# Patient Record
Sex: Male | Born: 1965 | Race: White | Hispanic: No | Marital: Married | Smoking: Former smoker
Health system: Southern US, Community
[De-identification: ages and names within clinical notes are randomized; demographics above are authoritative.]

## PROBLEM LIST (undated history)

## (undated) DIAGNOSIS — E118 Type 2 diabetes mellitus with unspecified complications: Secondary | ICD-10-CM

## (undated) DIAGNOSIS — E785 Hyperlipidemia, unspecified: Secondary | ICD-10-CM

## (undated) DIAGNOSIS — I2510 Atherosclerotic heart disease of native coronary artery without angina pectoris: Secondary | ICD-10-CM

## (undated) DIAGNOSIS — E119 Type 2 diabetes mellitus without complications: Secondary | ICD-10-CM

## (undated) HISTORY — DX: Type 2 diabetes mellitus with unspecified complications: E11.8

## (undated) HISTORY — DX: Atherosclerotic heart disease of native coronary artery without angina pectoris: I25.10

## (undated) HISTORY — DX: Hyperlipidemia, unspecified: E78.5

---

## 2004-06-25 ENCOUNTER — Ambulatory Visit: Payer: Self-pay | Admitting: Sports Medicine

## 2004-06-25 ENCOUNTER — Observation Stay (HOSPITAL_COMMUNITY): Admission: EM | Admit: 2004-06-25 | Discharge: 2004-06-26 | Payer: Self-pay | Admitting: Emergency Medicine

## 2004-11-06 ENCOUNTER — Ambulatory Visit (HOSPITAL_COMMUNITY): Admission: RE | Admit: 2004-11-06 | Discharge: 2004-11-06 | Payer: Self-pay | Admitting: Chiropractic Medicine

## 2005-09-26 ENCOUNTER — Emergency Department (HOSPITAL_COMMUNITY): Admission: EM | Admit: 2005-09-26 | Discharge: 2005-09-26 | Payer: Self-pay | Admitting: Emergency Medicine

## 2006-02-10 IMAGING — CR DG KNEE COMPLETE 4+V*R*
4 series · 4 of 4 positions shown · non-contrast
Comparison: None.
COMPARISON: None.

CLINICAL DATA: Fell, right knee and lower leg pain.

RIGHT KNEE - 4 VIEW  06/26/2004:

[view not recorded (1 of 4)]
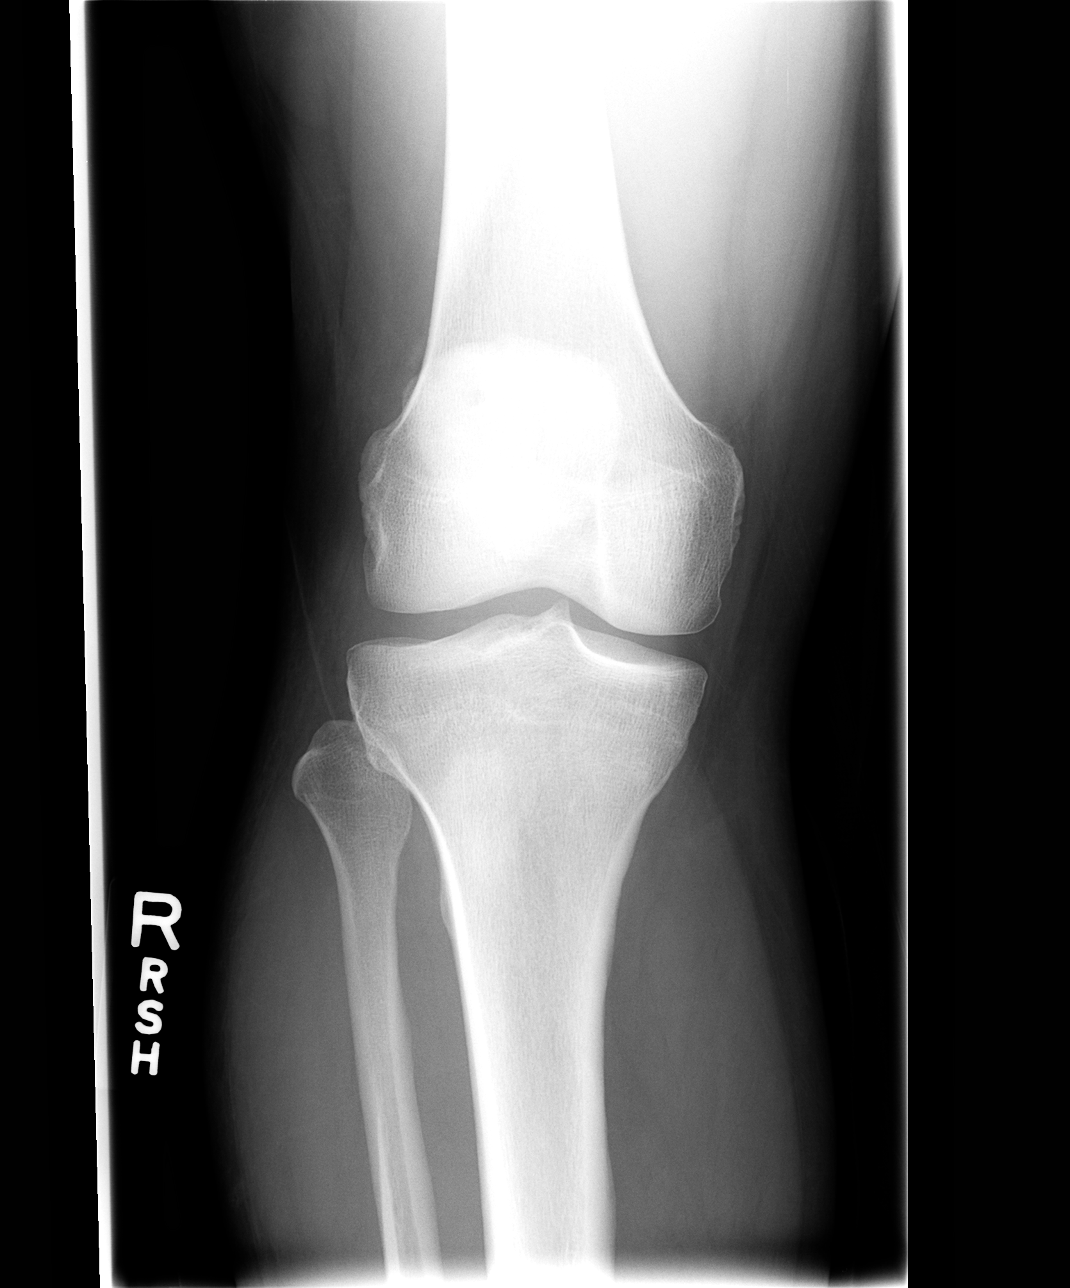

[view not recorded (2 of 4)]
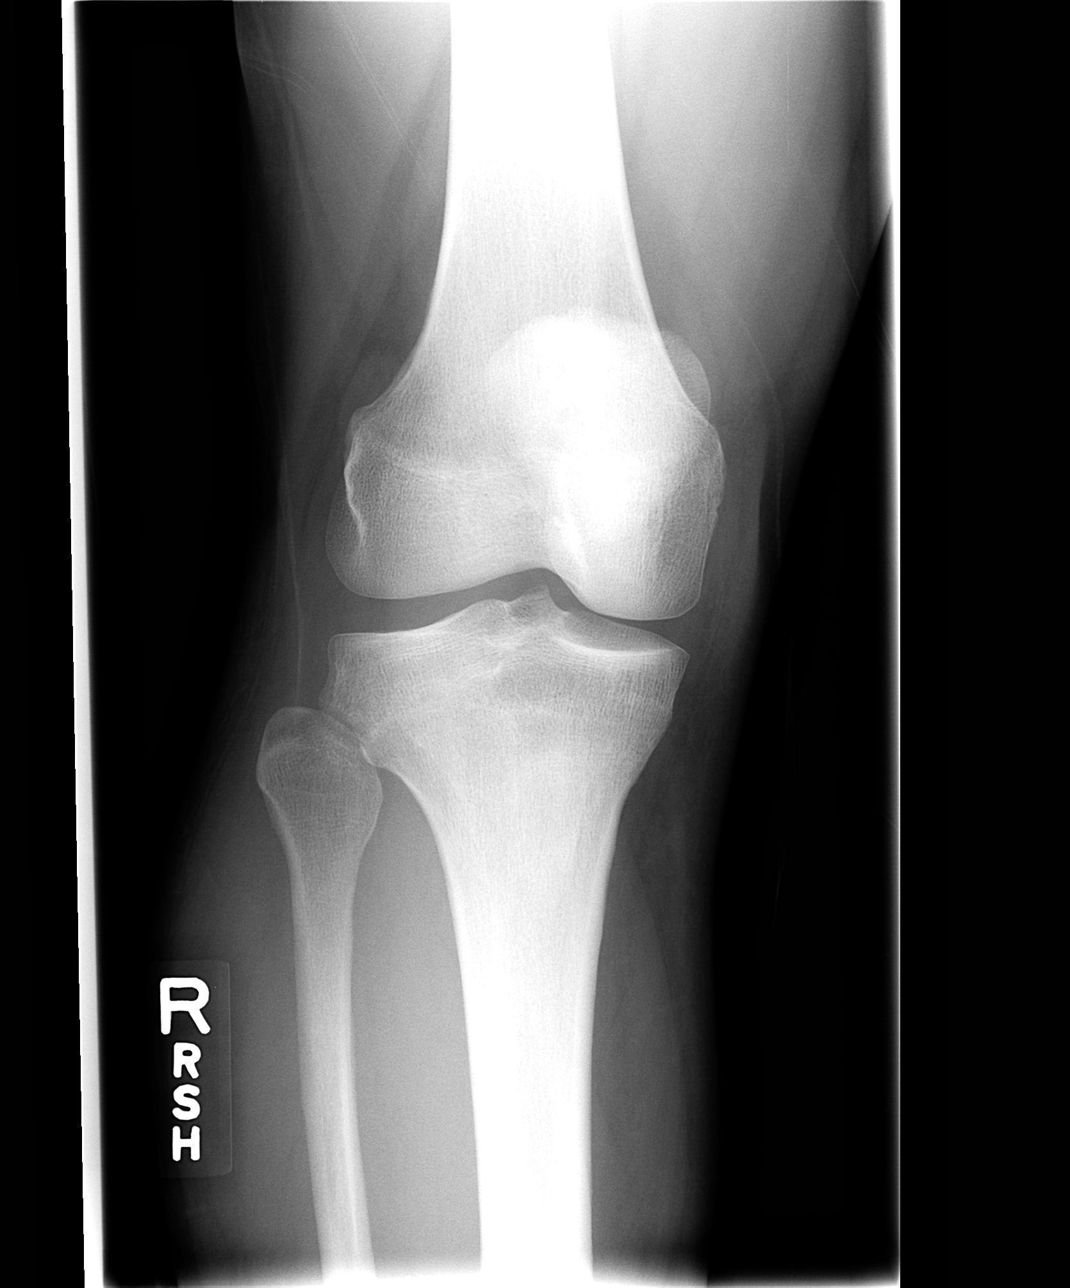

[view not recorded (3 of 4)]
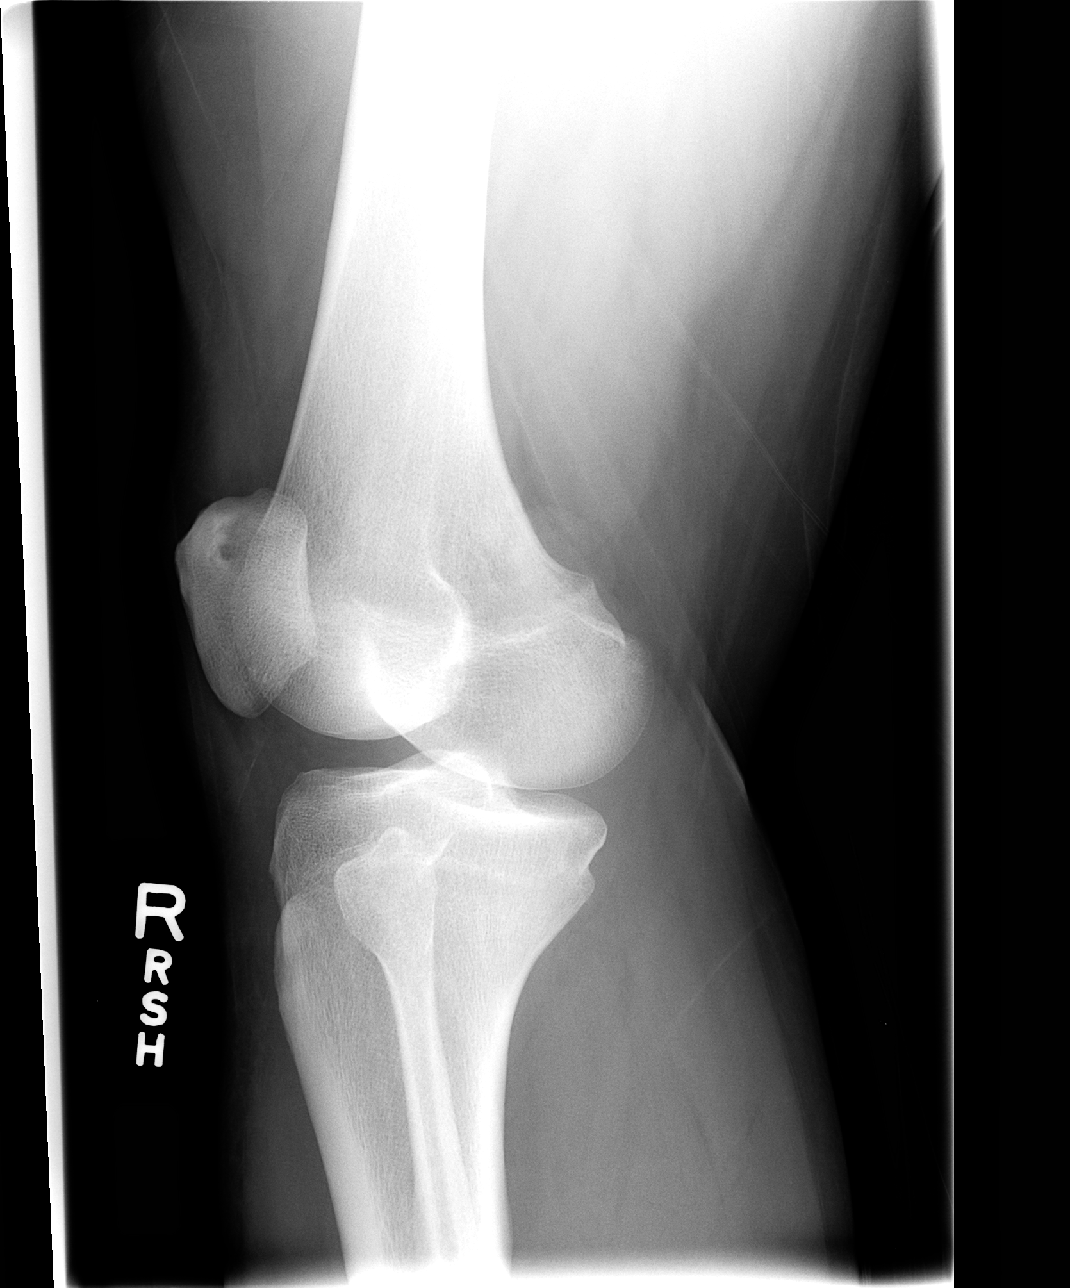

[view not recorded (4 of 4)]
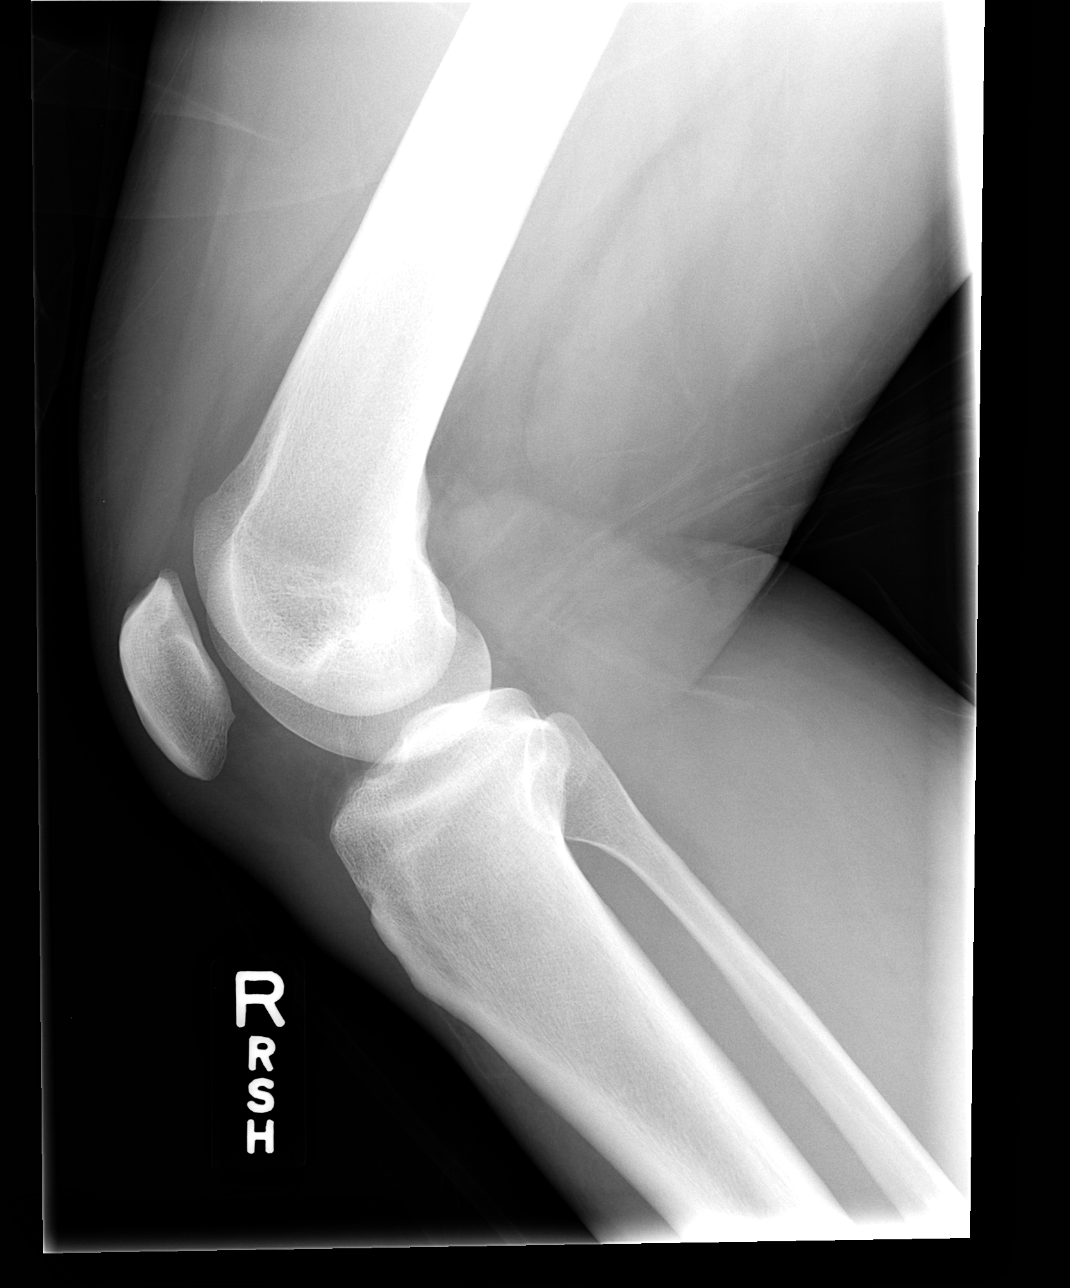

[4 of 4 positions shown; findings below may reference images not displayed]

FINDINGS: There is no evidence of acute or subacute fracture or dislocation.
The joint spaces are well-preserved. There is no evidence of significant joint
effusion. Note is made of a small lucency in the superolateral patella in the
characteristic location for a dorsal defect.
IMPRESSION: No acute skeletal abnormalities. Dorsal defect of the patella. 

RIGHT TIBIA-FIBULA - 2 VIEW  06/26/2004:
FINDINGS: There is no evidence of acute or subacute fracture. The visualized
ankle joint appears intact. There are no intrinsic osseous abnormalities apart
from a small plantar calcaneal spur.
IMPRESSION: Normal examination of the tibia and fibula. Small plantar calcaneal spur.

## 2006-08-14 ENCOUNTER — Emergency Department (HOSPITAL_COMMUNITY): Admission: EM | Admit: 2006-08-14 | Discharge: 2006-08-14 | Payer: Self-pay | Admitting: Emergency Medicine

## 2010-08-18 NOTE — H&P (Signed)
Jason Joseph, PODOLSKY NO.:  [id number]   MEDICAL RECORD NO.:  [id number]          PATIENT TYPE:  EMS   LOCATION:  MAJO                         FACILITY:  [facility]   PHYSICIAN:  Melina Fiddler, MD DATE OF BIRTH:  07/06/1965   DATE OF ADMISSION:  06/25/2004  DATE OF DISCHARGE:                                HISTORY & PHYSICAL   PHYSICIANS:  Attending physician:  Melina Fiddler, MD  Primary care physician:  Gentry Fitz.   CHIEF COMPLAINT:  Chest pain and right leg pain.   HISTORY OF PRESENT ILLNESS:  The patient is a 45 year old white male who  presents with atypical chest pain.  The patient states it started  approximately two months ago.  The pain is substernal and left sided and is  described as a squeezing sensation.  The pain is atypical and can be brought  on by stress; however, the pain can also be brought on by exertion such as  running and sometimes improves with relaxation.  It is associated with left  arm pain and occasional nausea.  The patient has no known history of  hypertension, high cholesterol, or diabetes.  The patient does have a 20-  pack-year history of tobacco use.  The patient does have a family history of  coronary artery disease including several uncles with MIs or coronary artery  disease in their 44s.  The patient has also been under a great deal of  stress recently as he has just lost his stepdaughter.   The patient has also been having dyspepsia and heartburn for approximately  two years, especially with spicy foods.  This has been getting worse over  the last few months.  He also gives a history of possible black stools.  He  denies any hematochezia, denies any hematemesis.  He says that the pain in  the chest is different from the dyspepsia that he feels after he eats spicy  foods.  Denies any right upper quadrant pain.   The patient also presents with right knee pain.  He scrapped/rolled his knee  on the ground while working  Thursday and is developing increasing pain over  the tibial tuberosity with mild erythema on the tibial surface.  The patient  also reports subjective fevers.   REVIEW OF SYSTEMS:  See HPI.   PAST MEDICAL HISTORY:  1.  Stomach pain/dyspepsia.  2.  History of syncope with exertion. Was told he had a problem with his      mitral valve.  3.  History of appendectomy.   MEDICATIONS:  None.   ALLERGIES:  No known drug allergies.   SOCIAL HISTORY:  The patient works in Furniture conservator/restorer.  Lives with fiancee and  two boys from another marriage.  He has a 20-pack-year history of tobacco  use.  He reports occasional alcohol.  Denies emphatically recreational  drugs.  Step daughter recently died; hence, he has been under a lot stress  recently.   FAMILY HISTORY:  Mother is living but has questionable medical health as  patient does not keep in touch.  Father is  living and has a history of high  cholesterol as well as hypertension.  Maternal uncle died from MI at age 60.  Also has paternal uncles with MI and coronary artery disease in their 6s  and 30s.   PHYSICAL EXAMINATION:  VITAL SIGNS:  Temperature 98.1, pulse 82, respiratory  rate 20, blood pressure 113/61.  SAO2 98% on room air.  GENERAL:  No apparent distress.  Alert and oriented x 3.  HEENT:  Atraumatic and normocephalic.  Pupils equal, round, and reactive to  light.  Extraocular movements are intact.  TMs and canals clear on  examination.  There is no erythema or exudate in oropharynx.  NECK:  Supple.  There is no lymphadenopathy and no thyromegaly.  CARDIOVASCULAR:  Regular rate and rhythm.  No murmurs, rubs, or gallops.  No  JVD, 2/4 equal and symmetric bilateral radial pulses.  PULMONARY:  Clear to auscultation bilaterally.  No wheezes, crackles, or  rales.  ABDOMEN:  Soft, nondistended.  Minimally tender in the epigastrium.  Positive bowel sounds.  No guarding, no rebound.  The patient does have a  slight Murphy's sign.   EXTREMITIES:  Tender to palpation over the right tibial tuberosity with an  area 10 x 5 inches of erythema, warmth on the anterior right shin.  There is  no edema and no palpable cords in either lower extremity.  NEUROLOGIC:  Cranial nerves II-XII grossly intact.  Muscle strength 5/5,  equal and symmetric in upper and lower extremities.   LABORATORY DATA AND OTHER STUDIES:  White count 6.7, hemoglobin 14.0,  hematocrit 39.8, platelets 225.  Sodium 139, potassium 3.7, chloride 105,  bicarb 27.9, BUN 10, creatinine 0.9, glucose 125.  Myoglobin 81.8, CK-MB  less than 1, troponin less than 0.05.   Chest x-ray showed poor inspiration but no cardiopulmonary disease.   EKG shows normal sinus rhythm with no ischemic changes.   ASSESSMENT AND PLAN:  A 45 year old white male with atypical chest pain.   1.  Atypical chest pain.  Will admit and cycle cardiac enzymes q.8h. x 3.      We will also repeat hemoglobin in the a.m.  Will start the patient on      aspirin.  The patient has a medium risk for coronary artery disease of      21% given his age of approximately 44 and also history of smoking.  Will      likely need followup with cardiology as outpatient for a stress test.      Will monitor on telemetry overnight.  Differential diagnoses include      angina, dyspepsia/peptic ulcer disease/gastroesophageal reflux disease,      anxiety/panic attacks, or biliary tree disease.  2.  Dyspepsia.  Will guaiac stools x 3 and begin IV Protonix. Consider      outpatient GI workup if guaiac-positive stools and hemoglobin remains      stable.  Will also check a CMP for signs of biliary tree disease and      check lipase for evidence of pancreatic inflammation.  3.  Medical surveillance.  Will counsel on tobacco cessation, check a      fasting lipid panel in the morning, check a hemoglobin A1C, and also      check a urine drug screen. 4.  Cellulitis.  Will check an x-ray to rule out possible fracture of  the      right tibial tuberosity given the point tenderness on exam.  Will also  begin Ancef 500 mg q.8h. IV for cellulitis.      WTP/MEDQ  D:  06/25/2004  T:  06/25/2004  Job:  [id number]

## 2010-08-18 NOTE — Discharge Summary (Signed)
NAMEGARLAND, Jason Joseph                 ACCOUNT NO.:  [id number]   MEDICAL RECORD NO.:  [id number]          PATIENT TYPE:  INP   LOCATION:  2021                         FACILITY:  [facility]   PHYSICIAN:  Broadus John T. Pickard II, MDDATE OF BIRTH:  02-10-1966   DATE OF ADMISSION:  06/25/2004  DATE OF DISCHARGE:  06/26/2004                                 DISCHARGE SUMMARY   ATTENDING PHYSICIAN:  Melina Fiddler, MD   PRIMARY CARE PHYSICIAN:  Priscille Heidelberg. Pamalee Leyden, M.D. [facility].   DISCHARGE DIAGNOSES:  1.  Chest pain, rule out myocardial infarction.  2.  Dyspepsia/gastroesophageal reflux disease.  3.  Right lower extremity cellulitis.  4.  Marijuana use.  5.  Potential panic disorder.   PROCEDURES:  1.  Chest x-ray dated March 26th showed poor inspiration with no acute lung      disease.  2.  Right tibia-fibular film from March 27th showed no acute skeletal      abnormalities, dorsal defect of the patella.   LABORATORY DATA:  White count on admission 6.7, hemoglobin 13.6, hematocrit  40.0, platelet count 225.  Sodium 139, potassium 3.7, chloride 105, bicarb  25, glucose 130, BUN 10, creatinine 0.9, total bilirubin 0.5, alkaline  phosphatase 46, AST 27, ALT 42.  Hemoglobin A1c 4.9.  Cardiac enzymes  negative x2 at the time of this dictation.  Lipase was 36 and normal.  Cholesterol 159, triglycerides 319, HDL 25, LDL 70.  Urine drug screen is  positive for benzodiazepines and positive for marijuana.   HISTORY OF PRESENT ILLNESS:  The patient is a 45 year old white male who  presented with:  1) Chest pain, 2) Dyspepsia; 3) Right lower extremity  cellulitis.   HOSPITAL COURSE:  PROBLEM NO. 1. CHEST PAIN:  Patient was admitted and  cardiac enzymes were cycled q.8h. x3.  EKG showed normal sinus rhythm and no  evidence of ischemic disease. At the time of this discharge summary, cardiac  enzymes were negative x2.  We were awaiting the third and final set of  cardiac  enzymes prior to discharge home.  I will see the patient in my  clinic in follow-up and refer him for an outpatient stress treadmill test.  Will send the patient home on aspirin 1 a day and also treat his risks  including smoking by counseling smoking cessation and hypertriglyceridemia,  thus starting TriCor 54 mg p.o. daily.  Hemoglobin A1c and fasting CBGs  suggest no evidence of diabetes mellitus at this time.   PROBLEM NO. 2.  RIGHT LOWER EXTREMITY CELLULITIS:  The patient was admitted  for Kefzol IV overnight, and was switched to Keflex 700 mg p.o. q.i.d. x12  days, to be completed as an outpatient.   PROBLEM NO. 3.  DYSPEPSIA:  Will send the patient home on Zantac 150 mg p.o.  q.h.s. and also, evaluate stools in the outpatient clinic for signs of  melena, as the patient may possibly have peptic ulcer disease.   FOLLOW UP:  The patient was instructed to follow-up with Dr. Tanya Nones at  [facility] on July 13, 2004 at 2 p.m.   DISCHARGE MEDICATIONS:  1.  Aspirin 81 mg p.o. daily.  2.  Keflex 500 mg p.o. q.i.d. x12 days.  3.  Zantac 150 mg p.o. q.h.s.  4.  TriCor 54 mg p.o. daily.   DISCHARGE INSTRUCTIONS:  Appreciate it if Dr. Tanya Nones would follow-up  melanotic stools and also arrange an outpatient stress treadmill test.      WTP/MEDQ  D:  06/26/2004  T:  06/26/2004  Job:  [id number]

## 2011-12-13 ENCOUNTER — Ambulatory Visit: Payer: Self-pay

## 2011-12-14 ENCOUNTER — Ambulatory Visit: Payer: Self-pay | Admitting: Family Medicine

## 2011-12-14 VITALS — BP 144/82 | HR 91 | Temp 98.4°F | Resp 17 | Ht 64.5 in | Wt 200.0 lb

## 2011-12-14 DIAGNOSIS — N390 Urinary tract infection, site not specified: Secondary | ICD-10-CM

## 2011-12-14 LAB — POCT UA - MICROSCOPIC ONLY
Bacteria, U Microscopic: NEGATIVE
Casts, Ur, LPF, POC: NEGATIVE
Crystals, Ur, HPF, POC: NEGATIVE
Epithelial cells, urine per micros: NEGATIVE
Mucus, UA: NEGATIVE
RBC, urine, microscopic: NEGATIVE
WBC, Ur, HPF, POC: NEGATIVE
Yeast, UA: NEGATIVE

## 2011-12-14 LAB — POCT URINALYSIS DIPSTICK
Bilirubin, UA: NEGATIVE
Blood, UA: NEGATIVE
Glucose, UA: NEGATIVE
Ketones, UA: NEGATIVE
Leukocytes, UA: NEGATIVE
Nitrite, UA: NEGATIVE
Protein, UA: NEGATIVE
Spec Grav, UA: 1.025
Urobilinogen, UA: 0.2
pH, UA: 6

## 2011-12-14 NOTE — Progress Notes (Signed)
46 yo man with recent first UTI one month ago.  Treated in [facility], no symptoms now.  Objective:  NAD No CVAT Results for orders placed in visit on 12/14/11  POCT UA - MICROSCOPIC ONLY      Component Value Range   WBC, Ur, HPF, POC neg     RBC, urine, microscopic neg     Bacteria, U Microscopic neg     Mucus, UA neg     Epithelial cells, urine per micros neg     Crystals, Ur, HPF, POC neg     Casts, Ur, LPF, POC neg     Yeast, UA neg    POCT URINALYSIS DIPSTICK      Component Value Range   Color, UA yellow     Clarity, UA clear     Glucose, UA neg     Bilirubin, UA neg     Ketones, UA neg     Spec Grav, UA 1.025     Blood, UA neg     pH, UA 6.0     Protein, UA neg     Urobilinogen, UA 0.2     Nitrite, UA neg     Leukocytes, UA Negative      Assessment: resolved UTI

## 2013-07-02 ENCOUNTER — Other Ambulatory Visit: Payer: Self-pay | Admitting: Family Medicine

## 2013-07-02 DIAGNOSIS — R109 Unspecified abdominal pain: Secondary | ICD-10-CM

## 2013-07-03 ENCOUNTER — Ambulatory Visit
Admission: RE | Admit: 2013-07-03 | Discharge: 2013-07-03 | Disposition: A | Discharge status: Home / Self Care | Payer: 59 | Source: Ambulatory Visit | Attending: Family Medicine | Admitting: Family Medicine

## 2013-07-03 DIAGNOSIS — R109 Unspecified abdominal pain: Secondary | ICD-10-CM

## 2013-07-03 MED ORDER — IOHEXOL 300 MG/ML  SOLN
125.0000 mL | Freq: Once | INTRAMUSCULAR | Status: AC | PRN
  Administered 2013-07-03: 125 mL via INTRAVENOUS

## 2013-07-08 ENCOUNTER — Other Ambulatory Visit: Payer: Self-pay

## 2016-11-21 ENCOUNTER — Other Ambulatory Visit: Payer: Self-pay | Admitting: Internal Medicine

## 2016-11-21 DIAGNOSIS — N50819 Testicular pain, unspecified: Secondary | ICD-10-CM

## 2016-11-26 ENCOUNTER — Ambulatory Visit
Admission: RE | Admit: 2016-11-26 | Discharge: 2016-11-26 | Disposition: A | Discharge status: Home / Self Care | Payer: PRIVATE HEALTH INSURANCE | Source: Ambulatory Visit | Attending: Internal Medicine | Admitting: Internal Medicine

## 2016-11-26 DIAGNOSIS — N50819 Testicular pain, unspecified: Secondary | ICD-10-CM

## 2016-12-27 ENCOUNTER — Other Ambulatory Visit: Payer: Self-pay | Admitting: Internal Medicine

## 2016-12-27 DIAGNOSIS — R945 Abnormal results of liver function studies: Secondary | ICD-10-CM

## 2017-01-02 ENCOUNTER — Ambulatory Visit
Admission: RE | Admit: 2017-01-02 | Discharge: 2017-01-02 | Disposition: A | Discharge status: Home / Self Care | Payer: PRIVATE HEALTH INSURANCE | Source: Ambulatory Visit | Attending: Internal Medicine | Admitting: Internal Medicine

## 2017-01-02 DIAGNOSIS — R945 Abnormal results of liver function studies: Secondary | ICD-10-CM

## 2017-01-10 LAB — HM COLONOSCOPY

## 2017-05-16 ENCOUNTER — Other Ambulatory Visit: Payer: Self-pay | Admitting: Gastroenterology

## 2017-05-16 DIAGNOSIS — R7989 Other specified abnormal findings of blood chemistry: Secondary | ICD-10-CM

## 2017-05-16 DIAGNOSIS — R945 Abnormal results of liver function studies: Principal | ICD-10-CM

## 2017-05-21 ENCOUNTER — Other Ambulatory Visit: Payer: Self-pay | Admitting: Gastroenterology

## 2017-05-21 DIAGNOSIS — R945 Abnormal results of liver function studies: Principal | ICD-10-CM

## 2017-05-21 DIAGNOSIS — R7989 Other specified abnormal findings of blood chemistry: Secondary | ICD-10-CM

## 2017-06-07 ENCOUNTER — Ambulatory Visit
Admission: RE | Admit: 2017-06-07 | Discharge: 2017-06-07 | Disposition: A | Discharge status: Home / Self Care | Payer: PRIVATE HEALTH INSURANCE | Source: Ambulatory Visit | Attending: Gastroenterology | Admitting: Gastroenterology

## 2017-06-07 DIAGNOSIS — R7989 Other specified abnormal findings of blood chemistry: Secondary | ICD-10-CM

## 2017-06-07 DIAGNOSIS — R945 Abnormal results of liver function studies: Principal | ICD-10-CM

## 2017-06-18 ENCOUNTER — Ambulatory Visit
Admission: RE | Admit: 2017-06-18 | Discharge: 2017-06-18 | Disposition: A | Discharge status: Home / Self Care | Payer: PRIVATE HEALTH INSURANCE | Source: Ambulatory Visit | Attending: Gastroenterology | Admitting: Gastroenterology

## 2018-03-07 IMAGING — US US SCROTUM
1 series · 14 of 25 positions shown · non-contrast
Comparison: None.

CLINICAL DATA: Right testicular pain for 1 month.

EXAM:
SCROTAL ULTRASOUND
DOPPLER ULTRASOUND OF THE TESTICLES
TECHNIQUE: Complete ultrasound examination of the testicles, epididymis, and
other scrotal structures was performed. Color and spectral Doppler
ultrasound were also utilized to evaluate blood flow to the
testicles.

[Series 1: us scrotum · 0.08mm/px · 14 of 57 slices shown]
[im 1/57]
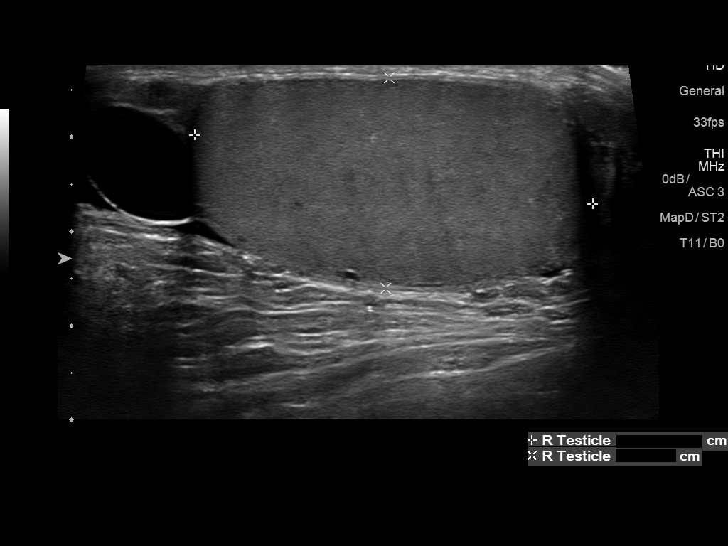
[im 5/57]
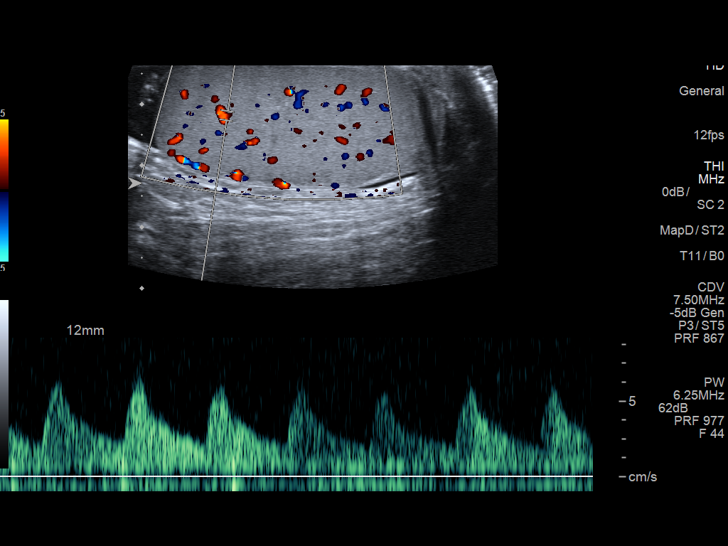
[im 10/57]
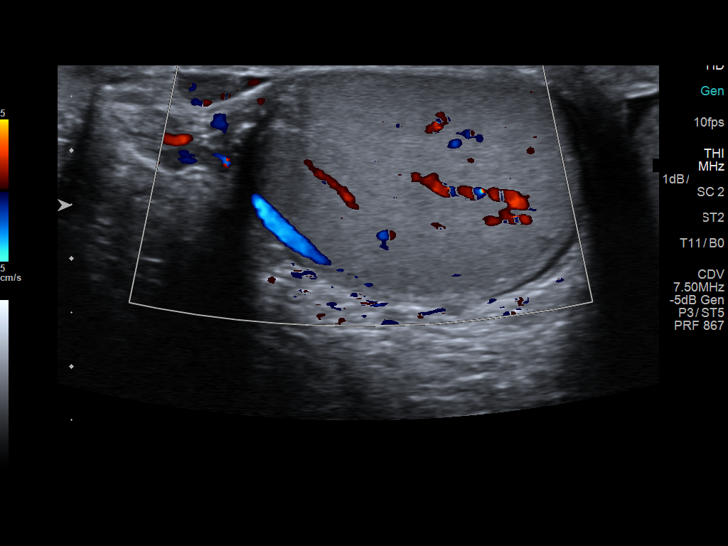
[im 15/57]
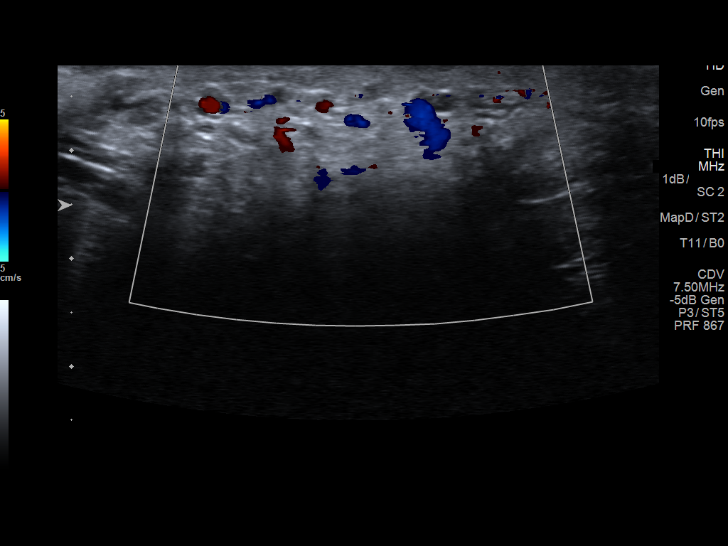
[im 19/57]
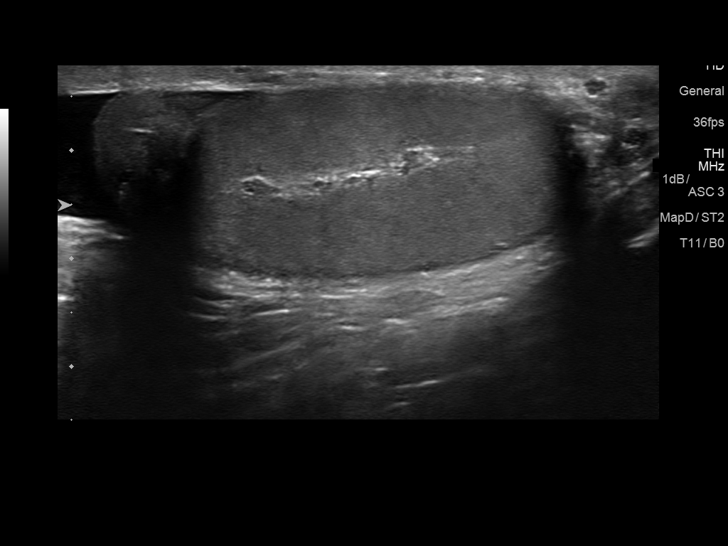
[im 22/57]
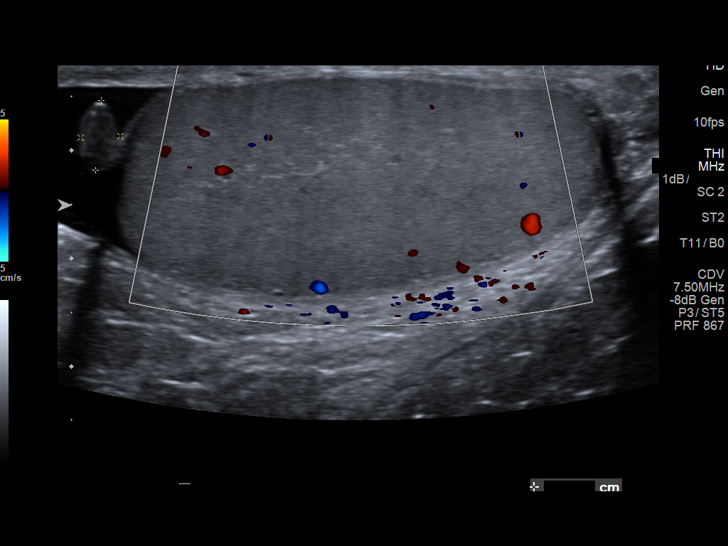
[im 26/57]
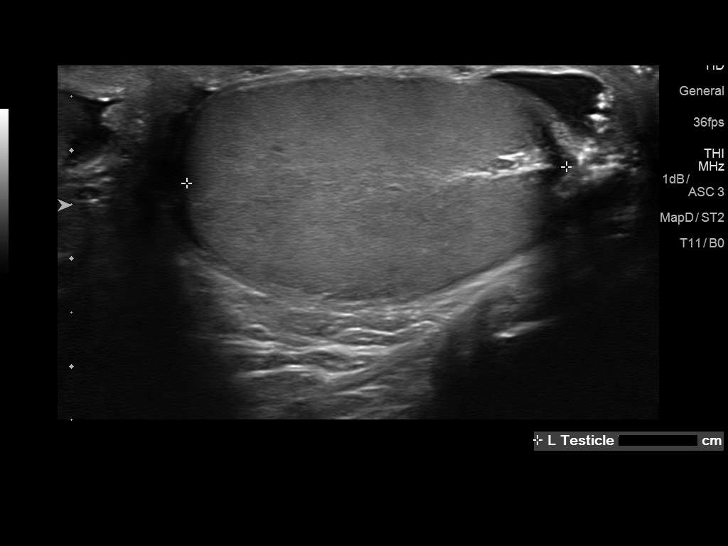
[im 31/57]
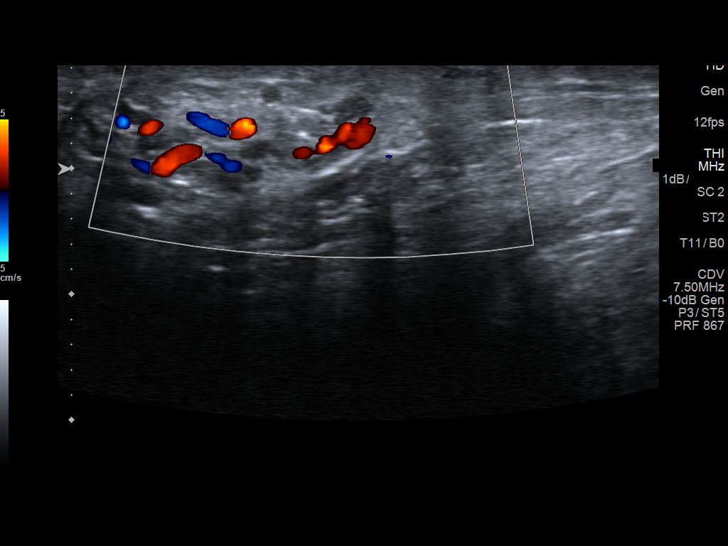
[im 36/57]
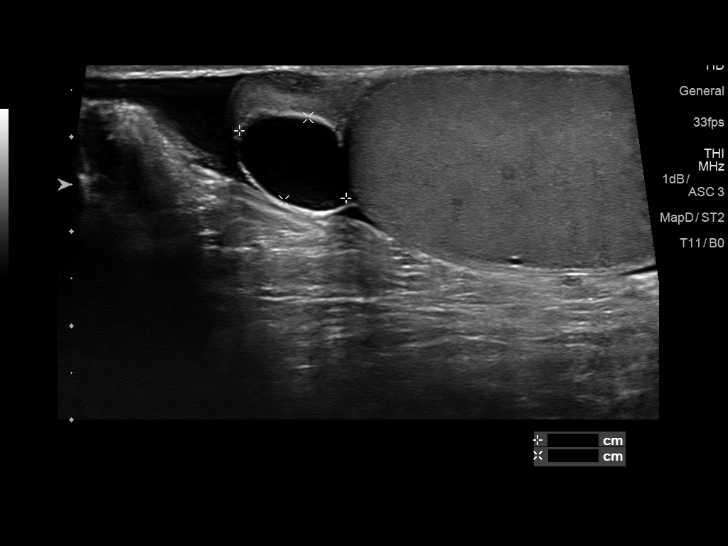
[im 38/57]
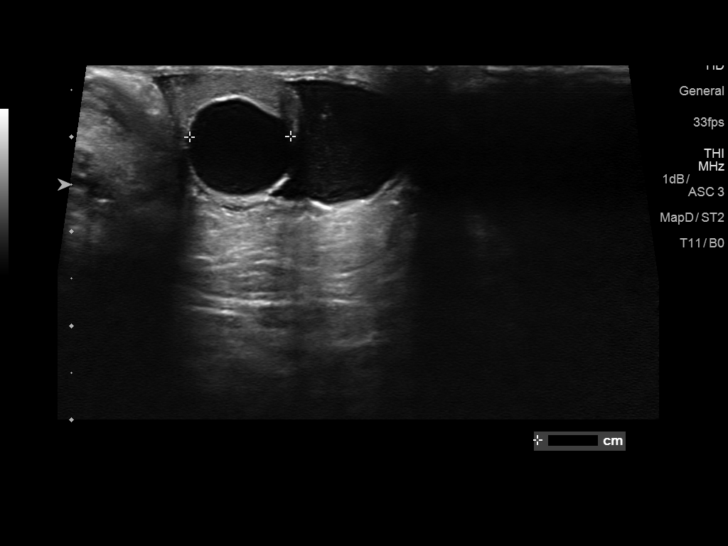
[im 43/57]
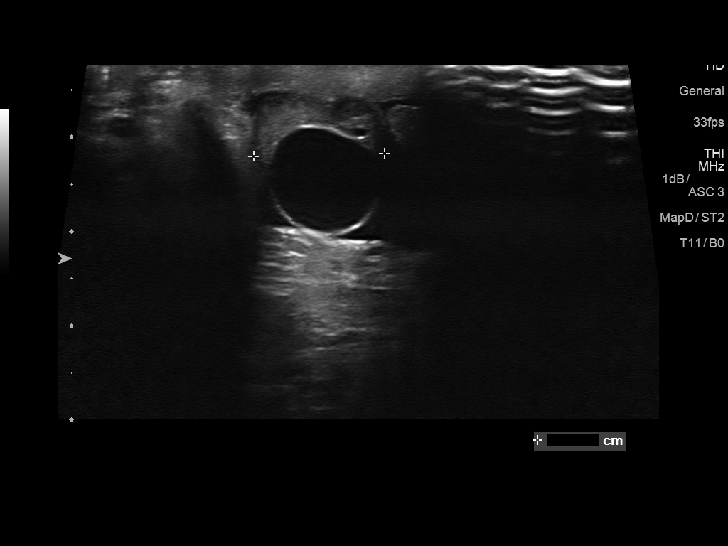
[im 47/57]
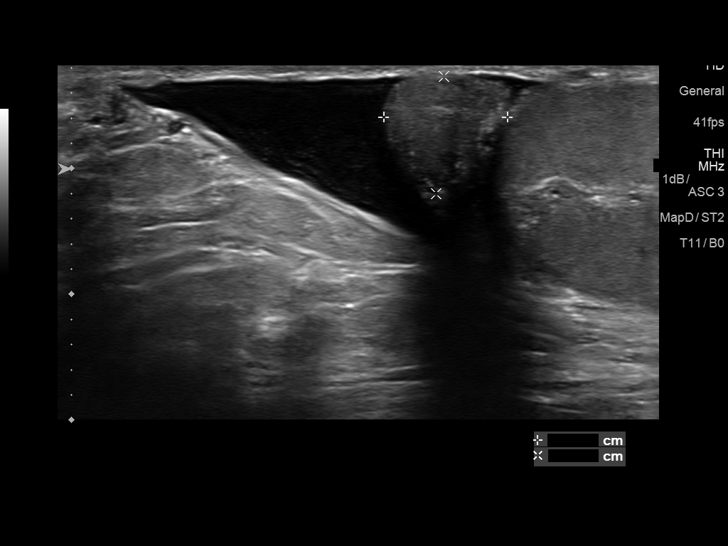
[im 52/57]
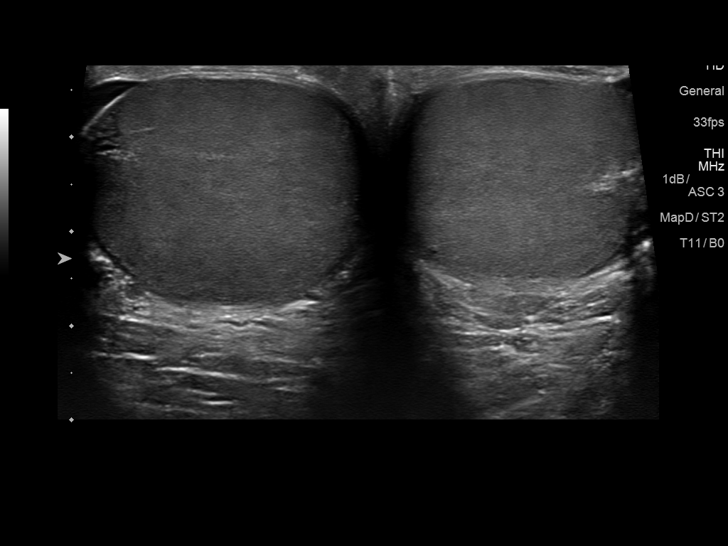
[im 57/57]
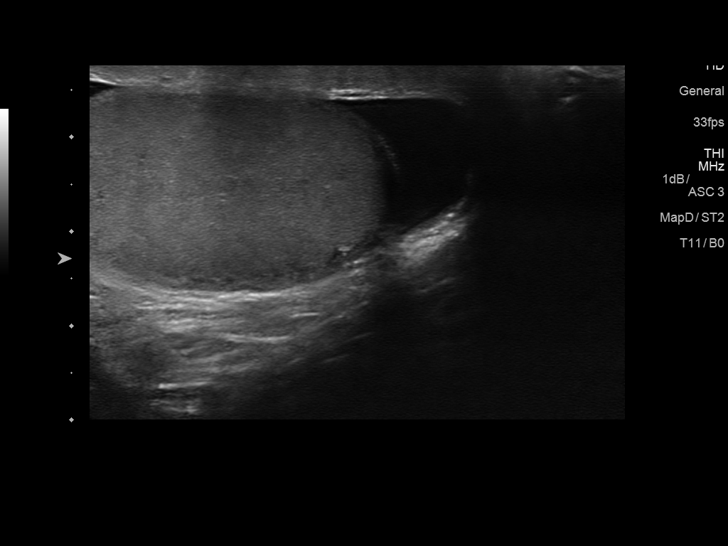

[14 of 25 positions shown; findings below may reference images not displayed]

FINDINGS: Right testicle

Measurements: 4.3 x 3.0 x 2.2 cm. No mass or microlithiasis
visualized.

Left testicle

Measurements: 4.6 x 3.5 x 2.1 cm. No mass or microlithiasis
visualized.

Right epididymis:  1.3 cm cyst is noted.

Left epididymis:  Normal in size and appearance.

Hydrocele:  Mild bilateral hydroceles are noted.

Varicocele:  None visualized.

Pulsed Doppler interrogation of both testes demonstrates normal low
resistance arterial and venous waveforms bilaterally.
IMPRESSION: No evidence of testicular mass or torsion. Mild bilateral
hydroceles. 1.3 cm right epididymal cyst which corresponds to area
of palpable concern.

## 2019-01-07 ENCOUNTER — Other Ambulatory Visit: Payer: Self-pay

## 2019-01-07 DIAGNOSIS — Z20822 Contact with and (suspected) exposure to covid-19: Secondary | ICD-10-CM

## 2019-01-08 LAB — NOVEL CORONAVIRUS, NAA: SARS-CoV-2, NAA: NOT DETECTED

## 2019-06-03 ENCOUNTER — Ambulatory Visit
Admission: EM | Admit: 2019-06-03 | Discharge: 2019-06-03 | Disposition: A | Discharge status: Home / Self Care | Payer: PRIVATE HEALTH INSURANCE

## 2019-06-03 DIAGNOSIS — M5441 Lumbago with sciatica, right side: Secondary | ICD-10-CM

## 2019-06-03 HISTORY — DX: Type 2 diabetes mellitus without complications: E11.9

## 2019-06-03 MED ORDER — METHYLPREDNISOLONE SODIUM SUCC 125 MG IJ SOLR
80.0000 mg | Freq: Once | INTRAMUSCULAR | Status: AC
  Administered 2019-06-03: 80 mg via INTRAMUSCULAR

## 2019-06-03 NOTE — Discharge Instructions (Addendum)
Recommend RICE: rest, ice, compression, elevation as needed for pain.    Heat therapy (hot compress, warm wash red, hot showers, etc.) can help relax muscles and soothe muscle aches. Cold therapy (ice packs) can be used to help swelling both after injury and after prolonged use of areas of chronic pain/aches.  For pain: recommend 350 mg-1000 mg of Tylenol (acetaminophen) and/or 200 mg - 800 mg of Advil (ibuprofen, Motrin) every 8 hours as needed.  May alternate between the two throughout the day as they are generally safe to take together.  DO NOT exceed more than 3000 mg of Tylenol or 3200 mg of ibuprofen in a 24 hour period as this could damage your stomach, kidneys, liver, or increase your bleeding risk.  Return for worsening pain, numbness in leg, difficulty walking, change in bowel or bladder habit, fever.

## 2019-06-03 NOTE — ED Provider Notes (Signed)
[facility]    CSN: [id number] Arrival date & time: 06/03/19  0907      History   Chief Complaint Chief Complaint  Patient presents with  . Back Pain    HPI Jason Joseph is a 54 y.o. male with history of diabetes presenting for evaluation of right and right leg that extends to foot.  Patient states symptoms have been ongoing since Sunday: States he was playing soccer with his grandkids which is different from his baseline.  Denies snap/pop/tearing sensation.  Tried aspirin without significant relief.  Patient states that he is supposed to work tonight, though has to do heavy lifting and is unsure if he is able to do so.  Denies saddle area anesthesia, lower extremity numbness or weakness, change in bowel or bladder habit, fever.    Past Medical History:  Diagnosis Date  . Diabetes mellitus without complication (HCC)     There are no problems to display for this patient.   History reviewed. No pertinent surgical history.     Home Medications    Prior to Admission medications   Medication Sig Start Date End Date Taking? Authorizing Provider  FLUoxetine (PROZAC) 10 MG tablet Take 10 mg by mouth daily.   Yes [provider]  metFORMIN (GLUCOPHAGE) 500 MG tablet Take by mouth 2 (two) times daily with a meal.   Yes [provider]  aspirin 81 MG tablet Take 81 mg by mouth daily.    [provider]    Family History History reviewed. No pertinent family history.  Social History Social History   Tobacco Use  . Smoking status: Former Smoker    Start date: 04/15/2011  . Smokeless tobacco: Never Used  Substance Use Topics  . Alcohol use: Never  . Drug use: Never     Allergies   Patient has no known allergies.   Review of Systems As per HPI   Physical Exam Triage Vital Signs ED Triage Vitals  Enc Vitals Group     BP      Pulse      Resp      Temp      Temp src      SpO2      Weight      Height      Head  Circumference      Peak Flow      Pain Score      Pain Loc      Pain Edu?      Excl. in GC?    No data found.  Updated Vital Signs BP (!) 161/79 (BP Location: Left Arm)   Pulse 80   Temp 98.9 F (37.2 C) (Oral)   Resp 18   SpO2 98%   Visual Acuity Right Eye Distance:   Left Eye Distance:   Bilateral Distance:    Right Eye Near:   Left Eye Near:    Bilateral Near:     Physical Exam Constitutional:      General: He is not in acute distress. HENT:     Head: Normocephalic and atraumatic.  Eyes:     General: No scleral icterus.    Pupils: Pupils are equal, round, and reactive to light.  Cardiovascular:     Rate and Rhythm: Normal rate.  Pulmonary:     Effort: Pulmonary effort is normal. No respiratory distress.     Breath sounds: No wheezing.  Musculoskeletal:     Cervical back: Normal.     Thoracic  back: Normal.     Lumbar back: Tenderness present. No spasms or bony tenderness. Decreased range of motion. Positive right straight leg raise test. Negative left straight leg raise test. No scoliosis.       Back:     Comments: Decreased ROM second to pain  Skin:    Coloration: Skin is not jaundiced or pale.     Findings: No rash.  Neurological:     Mental Status: He is alert and oriented to person, place, and time.     Sensory: No sensory deficit.     Deep Tendon Reflexes: Reflexes normal.      UC Treatments / Results  Labs (all labs ordered are listed, but only abnormal results are displayed) Labs Reviewed - No data to display  EKG   Radiology No results found.  Procedures Procedures (including critical care time)  Medications Ordered in UC Medications  methylPREDNISolone sodium succinate (SOLU-MEDROL) 125 mg/2 mL injection 80 mg (has no administration in time range)    Initial Impression / Assessment and Plan / UC Course  I have reviewed the triage vital signs and the nursing notes.  Pertinent labs & imaging results that were available during my  care of the patient were reviewed by me and considered in my medical decision making (see chart for details).     Patient appears well in office: H&P consistent with acute right low back pain with sciatica.  Patient given IM Solu-Medrol in office which he tolerated well.  Discussed supportive measures as outlined below.  Work note provided.  Information for occupational health and PCP given for follow-up if needed.  Return precautions discussed, patient verbalized understanding and is agreeable to plan. Final Clinical Impressions(s) / UC Diagnoses   Final diagnoses:  Acute right-sided low back pain with right-sided sciatica     Discharge Instructions     Recommend RICE: rest, ice, compression, elevation as needed for pain.    Heat therapy (hot compress, warm wash red, hot showers, etc.) can help relax muscles and soothe muscle aches. Cold therapy (ice packs) can be used to help swelling both after injury and after prolonged use of areas of chronic pain/aches.  For pain: recommend 350 mg-1000 mg of Tylenol (acetaminophen) and/or 200 mg - 800 mg of Advil (ibuprofen, Motrin) every 8 hours as needed.  May alternate between the two throughout the day as they are generally safe to take together.  DO NOT exceed more than 3000 mg of Tylenol or 3200 mg of ibuprofen in a 24 hour period as this could damage your stomach, kidneys, liver, or increase your bleeding risk.  Return for worsening pain, numbness in leg, difficulty walking, change in bowel or bladder habit, fever.    ED Prescriptions    None     PDMP not reviewed this encounter.   Neldon Mc [place], [place] 06/03/19 [phone]

## 2019-06-03 NOTE — ED Triage Notes (Signed)
Pt c/o lower back pain radiating down rt leg since Sunday. Denies injury but states was playing soccer on Sunday. Denies loss of bowels or bladder.

## 2020-08-14 ENCOUNTER — Encounter (HOSPITAL_COMMUNITY)
Admission: EM | Disposition: A | Discharge status: Home / Self Care | Payer: Self-pay | Source: Home / Self Care | Attending: Cardiovascular Disease

## 2020-08-14 ENCOUNTER — Other Ambulatory Visit: Payer: Self-pay

## 2020-08-14 ENCOUNTER — Encounter (HOSPITAL_COMMUNITY): Payer: Self-pay

## 2020-08-14 ENCOUNTER — Emergency Department (HOSPITAL_COMMUNITY): Payer: 59

## 2020-08-14 ENCOUNTER — Inpatient Hospital Stay (HOSPITAL_COMMUNITY)
Admission: EM | Admit: 2020-08-14 | Discharge: 2020-08-16 | DRG: 247 | Disposition: A | Discharge status: Home / Self Care | Payer: 59 | Attending: Cardiovascular Disease | Admitting: Cardiovascular Disease

## 2020-08-14 DIAGNOSIS — I2119 ST elevation (STEMI) myocardial infarction involving other coronary artery of inferior wall: Secondary | ICD-10-CM | POA: Diagnosis not present

## 2020-08-14 DIAGNOSIS — I2510 Atherosclerotic heart disease of native coronary artery without angina pectoris: Secondary | ICD-10-CM | POA: Diagnosis present

## 2020-08-14 DIAGNOSIS — Z8249 Family history of ischemic heart disease and other diseases of the circulatory system: Secondary | ICD-10-CM

## 2020-08-14 DIAGNOSIS — Z20822 Contact with and (suspected) exposure to covid-19: Secondary | ICD-10-CM | POA: Diagnosis present

## 2020-08-14 DIAGNOSIS — E119 Type 2 diabetes mellitus without complications: Secondary | ICD-10-CM

## 2020-08-14 DIAGNOSIS — E782 Mixed hyperlipidemia: Secondary | ICD-10-CM | POA: Diagnosis present

## 2020-08-14 DIAGNOSIS — Z87891 Personal history of nicotine dependence: Secondary | ICD-10-CM | POA: Diagnosis not present

## 2020-08-14 DIAGNOSIS — I213 ST elevation (STEMI) myocardial infarction of unspecified site: Secondary | ICD-10-CM | POA: Diagnosis not present

## 2020-08-14 DIAGNOSIS — I2111 ST elevation (STEMI) myocardial infarction involving right coronary artery: Secondary | ICD-10-CM | POA: Diagnosis not present

## 2020-08-14 DIAGNOSIS — Z955 Presence of coronary angioplasty implant and graft: Secondary | ICD-10-CM

## 2020-08-14 HISTORY — PX: CORONARY/GRAFT ACUTE MI REVASCULARIZATION: CATH118305

## 2020-08-14 HISTORY — PX: LEFT HEART CATH AND CORONARY ANGIOGRAPHY: CATH118249

## 2020-08-14 LAB — LIPID PANEL
Cholesterol: 222 mg/dL — ABNORMAL HIGH (ref 0–200)
HDL: 30 mg/dL — ABNORMAL LOW (ref >40)
LDL Cholesterol: UNDETERMINED mg/dL (ref 0–99)
Total CHOL/HDL Ratio: 7.4 RATIO
Triglycerides: 572 mg/dL — ABNORMAL HIGH (ref <150)
VLDL: UNDETERMINED mg/dL (ref 0–40)

## 2020-08-14 LAB — COMPREHENSIVE METABOLIC PANEL
ALT: 55 U/L — ABNORMAL HIGH (ref 0–44)
AST: 39 U/L (ref 15–41)
Albumin: 4.3 g/dL (ref 3.5–5.0)
Alkaline Phosphatase: 46 U/L (ref 38–126)
Anion gap: 13 (ref 5–15)
BUN: 15 mg/dL (ref 6–20)
CO2: 20 mmol/L — ABNORMAL LOW (ref 22–32)
Calcium: 9.7 mg/dL (ref 8.9–10.3)
Chloride: 104 mmol/L (ref 98–111)
Creatinine, Ser: 1.14 mg/dL (ref 0.61–1.24)
GFR, Estimated: >60 mL/min (ref >60)
Glucose, Bld: 190 mg/dL — ABNORMAL HIGH (ref 70–99)
Potassium: 3.8 mmol/L (ref 3.5–5.1)
Sodium: 137 mmol/L (ref 135–145)
Total Bilirubin: 1 mg/dL (ref 0.3–1.2)
Total Protein: 7.7 g/dL (ref 6.5–8.1)

## 2020-08-14 LAB — HEMOGLOBIN A1C
Hgb A1c MFr Bld: 7.3 % — ABNORMAL HIGH (ref 4.8–5.6)
Hgb A1c MFr Bld: 7.3 % — ABNORMAL HIGH (ref 4.8–5.6)
Mean Plasma Glucose: 162.81 mg/dL
Mean Plasma Glucose: 162.81 mg/dL

## 2020-08-14 LAB — CBC WITH DIFFERENTIAL/PLATELET
Abs Immature Granulocytes: 0.05 10*3/uL (ref 0.00–0.07)
Basophils Absolute: 0.1 10*3/uL (ref 0.0–0.1)
Basophils Relative: 1 %
Eosinophils Absolute: 0.2 10*3/uL (ref 0.0–0.5)
Eosinophils Relative: 2 %
HCT: 45.7 % (ref 39.0–52.0)
Hemoglobin: 16.2 g/dL (ref 13.0–17.0)
Immature Granulocytes: 0 %
Lymphocytes Relative: 37 %
Lymphs Abs: 5.4 10*3/uL — ABNORMAL HIGH (ref 0.7–4.0)
MCH: 32.9 pg (ref 26.0–34.0)
MCHC: 35.4 g/dL (ref 30.0–36.0)
MCV: 92.7 fL (ref 80.0–100.0)
Monocytes Absolute: 1.3 10*3/uL — ABNORMAL HIGH (ref 0.1–1.0)
Monocytes Relative: 9 %
Neutro Abs: 7.5 10*3/uL (ref 1.7–7.7)
Neutrophils Relative %: 51 %
Platelets: 395 10*3/uL (ref 150–400)
RBC: 4.93 MIL/uL (ref 4.22–5.81)
RDW: 11.9 % (ref 11.5–15.5)
WBC: 14.6 10*3/uL — ABNORMAL HIGH (ref 4.0–10.5)
nRBC: 0 % (ref 0.0–0.2)

## 2020-08-14 LAB — GLUCOSE, CAPILLARY
Glucose-Capillary: 149 mg/dL — ABNORMAL HIGH (ref 70–99)
Glucose-Capillary: 156 mg/dL — ABNORMAL HIGH (ref 70–99)

## 2020-08-14 LAB — RESP PANEL BY RT-PCR (FLU A&B, COVID) ARPGX2
Influenza A by PCR: NEGATIVE
Influenza B by PCR: NEGATIVE
SARS Coronavirus 2 by RT PCR: NEGATIVE

## 2020-08-14 LAB — PROTIME-INR
INR: 0.9 (ref 0.8–1.2)
Prothrombin Time: 12 seconds (ref 11.4–15.2)

## 2020-08-14 LAB — TROPONIN I (HIGH SENSITIVITY)
Troponin I (High Sensitivity): 9 ng/L (ref <18)
Troponin I (High Sensitivity): 794 ng/L (ref <18)
Troponin I (High Sensitivity): 2137 ng/L (ref <18)

## 2020-08-14 LAB — APTT: aPTT: 28 seconds (ref 24–36)

## 2020-08-14 LAB — LDL CHOLESTEROL, DIRECT: Direct LDL: 95 mg/dL (ref 0–99)

## 2020-08-14 LAB — MRSA PCR SCREENING: MRSA by PCR: NEGATIVE

## 2020-08-14 SURGERY — CORONARY/GRAFT ACUTE MI REVASCULARIZATION
Anesthesia: LOCAL

## 2020-08-14 MED ORDER — HYDRALAZINE HCL 20 MG/ML IJ SOLN
10.0000 mg | INTRAMUSCULAR | Status: AC | PRN
  Administered 2020-08-14 (×2): 10 mg via INTRAVENOUS
  Filled 2020-08-14 (×2): qty 1

## 2020-08-14 MED ORDER — TIROFIBAN HCL IN NACL 5-0.9 MG/100ML-% IV SOLN: 0.1500 ug/kg/min | INTRAVENOUS | Status: AC

## 2020-08-14 MED ORDER — MIDAZOLAM HCL 2 MG/2ML IJ SOLN
INTRAMUSCULAR | Status: DC | PRN
  Administered 2020-08-14 (×3): 1 mg via INTRAVENOUS

## 2020-08-14 MED ORDER — MORPHINE SULFATE (PF) 2 MG/ML IV SOLN
INTRAVENOUS | Status: AC
  Administered 2020-08-14: 2 mg via INTRAVENOUS
  Filled 2020-08-14: qty 1

## 2020-08-14 MED ORDER — LIDOCAINE HCL (PF) 1 % IJ SOLN
INTRAMUSCULAR | Status: DC | PRN
  Administered 2020-08-14: 2 mL

## 2020-08-14 MED ORDER — OXYCODONE HCL 5 MG PO TABS: 5.0000 mg | ORAL_TABLET | ORAL | Status: DC | PRN

## 2020-08-14 MED ORDER — FENTANYL CITRATE (PF) 100 MCG/2ML IJ SOLN
INTRAMUSCULAR | Status: DC | PRN
  Administered 2020-08-14 (×2): 25 ug via INTRAVENOUS
  Administered 2020-08-14: 50 ug via INTRAVENOUS

## 2020-08-14 MED ORDER — SODIUM CHLORIDE 0.9 % IV SOLN: INTRAVENOUS | Status: AC

## 2020-08-14 MED ORDER — MORPHINE SULFATE (PF) 2 MG/ML IV SOLN
2.0000 mg | INTRAVENOUS | Status: DC | PRN
Start: 2020-08-14 — End: 2020-08-16

## 2020-08-14 MED ORDER — HEPARIN SODIUM (PORCINE) 1000 UNIT/ML IJ SOLN
INTRAMUSCULAR | Status: AC
  Filled 2020-08-14: qty 1

## 2020-08-14 MED ORDER — TIROFIBAN HCL IN NACL 5-0.9 MG/100ML-% IV SOLN
INTRAVENOUS | Status: AC | PRN
  Administered 2020-08-14: 0.15 ug/kg/min via INTRAVENOUS

## 2020-08-14 MED ORDER — TIROFIBAN (AGGRASTAT) BOLUS VIA INFUSION
INTRAVENOUS | Status: DC | PRN
  Administered 2020-08-14: 1870 ug via INTRAVENOUS

## 2020-08-14 MED ORDER — ASPIRIN 81 MG PO CHEW
81.0000 mg | CHEWABLE_TABLET | Freq: Every day | ORAL | Status: DC
  Administered 2020-08-15 – 2020-08-16 (×2): 81 mg via ORAL
  Filled 2020-08-14 (×2): qty 1

## 2020-08-14 MED ORDER — TICAGRELOR 90 MG PO TABS
90.0000 mg | ORAL_TABLET | Freq: Two times a day (BID) | ORAL | Status: DC
  Administered 2020-08-14 – 2020-08-16 (×4): 90 mg via ORAL
  Filled 2020-08-14 (×4): qty 1

## 2020-08-14 MED ORDER — VERAPAMIL HCL 2.5 MG/ML IV SOLN
INTRAVENOUS | Status: DC | PRN
  Administered 2020-08-14: 10 mL via INTRA_ARTERIAL

## 2020-08-14 MED ORDER — VERAPAMIL HCL 2.5 MG/ML IV SOLN
INTRAVENOUS | Status: AC
  Filled 2020-08-14: qty 2

## 2020-08-14 MED ORDER — SODIUM CHLORIDE 0.9 % IV SOLN: INTRAVENOUS | Status: DC

## 2020-08-14 MED ORDER — MIDAZOLAM HCL 2 MG/2ML IJ SOLN
INTRAMUSCULAR | Status: AC
  Filled 2020-08-14: qty 2

## 2020-08-14 MED ORDER — LIDOCAINE HCL (PF) 1 % IJ SOLN
INTRAMUSCULAR | Status: AC
  Filled 2020-08-14: qty 30

## 2020-08-14 MED ORDER — NITROGLYCERIN 1 MG/10 ML FOR IR/CATH LAB
INTRA_ARTERIAL | Status: DC | PRN
  Administered 2020-08-14: 200 ug

## 2020-08-14 MED ORDER — ONDANSETRON HCL 4 MG/2ML IJ SOLN
INTRAMUSCULAR | Status: AC
  Filled 2020-08-14: qty 2

## 2020-08-14 MED ORDER — HEPARIN SODIUM (PORCINE) 5000 UNIT/ML IJ SOLN
4000.0000 [IU] | Freq: Once | INTRAMUSCULAR | Status: AC
  Administered 2020-08-14: 4000 [IU] via INTRAVENOUS
  Filled 2020-08-14: qty 1

## 2020-08-14 MED ORDER — SODIUM CHLORIDE 0.9% FLUSH
3.0000 mL | Freq: Two times a day (BID) | INTRAVENOUS | Status: DC
  Administered 2020-08-15 – 2020-08-16 (×3): 3 mL via INTRAVENOUS

## 2020-08-14 MED ORDER — FENTANYL CITRATE (PF) 100 MCG/2ML IJ SOLN
INTRAMUSCULAR | Status: AC
  Filled 2020-08-14: qty 2

## 2020-08-14 MED ORDER — LABETALOL HCL 5 MG/ML IV SOLN: 10.0000 mg | INTRAVENOUS | Status: AC | PRN

## 2020-08-14 MED ORDER — TIROFIBAN HCL IN NACL 5-0.9 MG/100ML-% IV SOLN
INTRAVENOUS | Status: AC
  Filled 2020-08-14: qty 100

## 2020-08-14 MED ORDER — ASPIRIN 81 MG PO CHEW: 324.0000 mg | CHEWABLE_TABLET | Freq: Once | ORAL | Status: DC

## 2020-08-14 MED ORDER — METOPROLOL TARTRATE 25 MG PO TABS
25.0000 mg | ORAL_TABLET | Freq: Two times a day (BID) | ORAL | Status: DC
  Administered 2020-08-15 – 2020-08-16 (×2): 25 mg via ORAL
  Filled 2020-08-14 (×3): qty 1

## 2020-08-14 MED ORDER — ONDANSETRON HCL 4 MG/2ML IJ SOLN
4.0000 mg | Freq: Four times a day (QID) | INTRAMUSCULAR | Status: DC | PRN
  Administered 2020-08-15: 4 mg via INTRAVENOUS
  Filled 2020-08-14: qty 2

## 2020-08-14 MED ORDER — SODIUM CHLORIDE 0.9 % IV SOLN: 250.0000 mL | INTRAVENOUS | Status: DC | PRN

## 2020-08-14 MED ORDER — ACETAMINOPHEN 325 MG PO TABS
650.0000 mg | ORAL_TABLET | ORAL | Status: DC | PRN
  Administered 2020-08-14 – 2020-08-15 (×2): 650 mg via ORAL
  Filled 2020-08-14 (×2): qty 2

## 2020-08-14 MED ORDER — ASPIRIN 81 MG PO CHEW
324.0000 mg | CHEWABLE_TABLET | Freq: Once | ORAL | Status: AC
  Administered 2020-08-14: 324 mg via ORAL
  Filled 2020-08-14: qty 4

## 2020-08-14 MED ORDER — ONDANSETRON HCL 4 MG/2ML IJ SOLN
INTRAMUSCULAR | Status: AC
  Administered 2020-08-14: 4 mg
  Filled 2020-08-14: qty 2

## 2020-08-14 MED ORDER — HEPARIN SODIUM (PORCINE) 1000 UNIT/ML IJ SOLN
INTRAMUSCULAR | Status: DC | PRN
  Administered 2020-08-14: 8000 [IU] via INTRAVENOUS
  Administered 2020-08-14: 3000 [IU] via INTRAVENOUS

## 2020-08-14 MED ORDER — HEPARIN (PORCINE) IN NACL 1000-0.9 UT/500ML-% IV SOLN
INTRAVENOUS | Status: AC
  Filled 2020-08-14: qty 1500

## 2020-08-14 MED ORDER — TICAGRELOR 90 MG PO TABS
ORAL_TABLET | ORAL | Status: DC | PRN
  Administered 2020-08-14: 180 mg via ORAL

## 2020-08-14 MED ORDER — TICAGRELOR 90 MG PO TABS
ORAL_TABLET | ORAL | Status: AC
  Filled 2020-08-14: qty 1

## 2020-08-14 MED ORDER — HEPARIN (PORCINE) IN NACL 1000-0.9 UT/500ML-% IV SOLN
INTRAVENOUS | Status: DC | PRN
  Administered 2020-08-14 (×2): 500 mL

## 2020-08-14 MED ORDER — HEPARIN SODIUM (PORCINE) 5000 UNIT/ML IJ SOLN: 5000.0000 [IU] | Freq: Once | INTRAMUSCULAR | Status: DC

## 2020-08-14 MED ORDER — IOHEXOL 350 MG/ML SOLN
INTRAVENOUS | Status: DC | PRN
  Administered 2020-08-14: 160 mL

## 2020-08-14 MED ORDER — SODIUM CHLORIDE 0.9 % IV BOLUS
1000.0000 mL | Freq: Once | INTRAVENOUS | Status: AC
  Administered 2020-08-14: 1000 mL via INTRAVENOUS

## 2020-08-14 MED ORDER — INSULIN ASPART 100 UNIT/ML IJ SOLN
0.0000 [IU] | Freq: Three times a day (TID) | INTRAMUSCULAR | Status: DC
  Administered 2020-08-14: 1 [IU] via SUBCUTANEOUS
  Administered 2020-08-15 (×2): 2 [IU] via SUBCUTANEOUS
  Administered 2020-08-16: 1 [IU] via SUBCUTANEOUS

## 2020-08-14 MED ORDER — ATORVASTATIN CALCIUM 80 MG PO TABS
80.0000 mg | ORAL_TABLET | Freq: Every day | ORAL | Status: DC
  Administered 2020-08-14 – 2020-08-16 (×3): 80 mg via ORAL
  Filled 2020-08-14 (×3): qty 1

## 2020-08-14 MED ORDER — SODIUM CHLORIDE 0.9% FLUSH: 3.0000 mL | INTRAVENOUS | Status: DC | PRN

## 2020-08-14 SURGICAL SUPPLY — 23 items
BALLN SAPPHIRE 2.5X15 (BALLOONS) ×2
BALLN SAPPHIRE ~~LOC~~ 3.25X18 (BALLOONS) ×2 IMPLANT
BALLN SAPPHIRE ~~LOC~~ 3.5X15 (BALLOONS) ×2 IMPLANT
BALLOON SAPPHIRE 2.5X15 (BALLOONS) ×1 IMPLANT
CATH INFINITI 5 FR JL3.5 (CATHETERS) ×2 IMPLANT
CATH INFINITI 5FR AL1 (CATHETERS) ×2 IMPLANT
CATH INFINITI JR4 5F (CATHETERS) ×2 IMPLANT
CATH LAUNCHER 6FR AL.75 (CATHETERS) ×2 IMPLANT
CATH VISTA GUIDE 6FR XBLAD3.5 (CATHETERS) ×2 IMPLANT
DEVICE RAD COMP TR BAND LRG (VASCULAR PRODUCTS) ×2 IMPLANT
GLIDESHEATH SLEND A-KIT 6F 22G (SHEATH) ×2 IMPLANT
GLIDESHEATH SLEND SS 6F .021 (SHEATH) ×2 IMPLANT
GUIDEWIRE INQWIRE 1.5J.035X260 (WIRE) ×1 IMPLANT
INQWIRE 1.5J .035X260CM (WIRE) ×2
KIT ENCORE 26 ADVANTAGE (KITS) ×2 IMPLANT
KIT HEART LEFT (KITS) ×2 IMPLANT
PACK CARDIAC CATHETERIZATION (CUSTOM PROCEDURE TRAY) ×2 IMPLANT
STENT SYNERGY XD 3.0X32 (Permanent Stent) ×2 IMPLANT
STENT SYNERGY XD 3.50X20 (Permanent Stent) ×1 IMPLANT
SYNERGY XD 3.50X20 (Permanent Stent) ×2 IMPLANT
TRANSDUCER W/STOPCOCK (MISCELLANEOUS) ×2 IMPLANT
TUBING CIL FLEX 10 FLL-RA (TUBING) ×2 IMPLANT
WIRE COUGAR XT STRL 190CM (WIRE) ×2 IMPLANT

## 2020-08-14 NOTE — ED Provider Notes (Signed)
[facility] EMERGENCY DEPARTMENT Provider Note   CSN: [id number] Arrival date & time: 08/14/20  1151     History Chief Complaint  Patient presents with  . Chest Pain  . Shortness of Breath    Jason Joseph is a 55 y.o. male.  The history is provided by the spouse, the patient and medical records. The history is limited by the condition of the patient.  Chest Pain Pain location:  Substernal area Pain quality: aching, crushing and pressure   Pain severity:  Severe Onset quality:  Sudden Duration:  1 hour Timing:  Constant Progression:  Unchanged Chronicity:  New Context: not trauma   Relieved by:  Nothing Worsened by:  Nothing Ineffective treatments:  None tried Associated symptoms: diaphoresis, fatigue and shortness of breath   Associated symptoms: no abdominal pain, no altered mental status, no back pain, no cough, no fever, no headache, no lower extremity edema, no nausea, no palpitations and no vomiting   Risk factors: diabetes mellitus        Past Medical History:  Diagnosis Date  . Diabetes mellitus without complication (HCC)     There are no problems to display for this patient.   No past surgical history on file.     No family history on file.  Social History   Tobacco Use  . Smoking status: Former Smoker    Start date: 04/15/2011  . Smokeless tobacco: Never Used  Substance Use Topics  . Alcohol use: Never  . Drug use: Never    Home Medications Prior to Admission medications   Medication Sig Start Date End Date Taking? Authorizing Provider  aspirin 81 MG tablet Take 81 mg by mouth daily.    [provider]  FLUoxetine (PROZAC) 10 MG tablet Take 10 mg by mouth daily.    [provider]  metFORMIN (GLUCOPHAGE) 500 MG tablet Take by mouth 2 (two) times daily with a meal.    [provider]    Allergies    Patient has no known allergies.  Review of Systems   Review of Systems  Constitutional:  Positive for diaphoresis and fatigue. Negative for chills and fever.  Respiratory: Positive for chest tightness and shortness of breath. Negative for cough and wheezing.   Cardiovascular: Positive for chest pain. Negative for palpitations.  Gastrointestinal: Negative for abdominal pain, constipation, diarrhea, nausea and vomiting.  Genitourinary: Negative for dysuria.  Musculoskeletal: Negative for back pain.  Neurological: Positive for light-headedness. Negative for headaches.  Psychiatric/Behavioral: Negative for agitation and confusion.  All other systems reviewed and are negative.   Physical Exam Updated Vital Signs BP (!) 154/94   Pulse (!) 55   Resp (!) 24   SpO2 100%   Physical Exam Vitals and nursing note reviewed.  Constitutional:      General: He is in acute distress.     Appearance: He is well-developed. He is ill-appearing and diaphoretic. He is not toxic-appearing.  HENT:     Head: Normocephalic and atraumatic.  Eyes:     Conjunctiva/sclera: Conjunctivae normal.  Cardiovascular:     Rate and Rhythm: Regular rhythm. Bradycardia present.     Heart sounds: Normal heart sounds. No murmur heard.   Pulmonary:     Effort: Pulmonary effort is normal. No respiratory distress.     Breath sounds: Normal breath sounds. No decreased breath sounds, wheezing, rhonchi or rales.  Chest:     Chest wall: No tenderness.  Abdominal:     Palpations: Abdomen is  soft.     Tenderness: There is no abdominal tenderness.  Musculoskeletal:     Cervical back: Neck supple.     Right lower leg: No tenderness.     Left lower leg: No tenderness.  Skin:    General: Skin is warm.     Capillary Refill: Capillary refill takes less than 2 seconds.     Findings: No erythema.  Neurological:     General: No focal deficit present.     Mental Status: He is alert.  Psychiatric:        Mood and Affect: Mood is anxious.     ED Results / Procedures / Treatments   Labs (all labs ordered are  listed, but only abnormal results are displayed) Labs Reviewed  RESP PANEL BY RT-PCR (FLU A&B, COVID) [id number]  HEMOGLOBIN A1C  CBC WITH DIFFERENTIAL/PLATELET  PROTIME-INR  APTT  COMPREHENSIVE METABOLIC PANEL  LIPID PANEL  CBC WITH DIFFERENTIAL/PLATELET  TROPONIN I (HIGH SENSITIVITY)    EKG EKG Interpretation  Date/Time:  Sunday Aug 14 2020 11:56:00 EDT Ventricular Rate:  56 PR Interval:  160 QRS Duration: 80 QT Interval:  406 QTC Calculation: 391 R Axis:   76 Text Interpretation: Sinus bradycardia ST elevation consider inferior injury or acute infarct ** ** ACUTE MI / STEMI ** ** Consider right ventricular involvement in acute inferior infarct Abnormal ECG When compared to prior, now acute stemi Reconfirmed by Theda Belfast ([id number]) on 08/14/2020 12:02:24 PM   Radiology No results found.  Procedures Procedures   CRITICAL CARE Performed by: Canary Brim Jacarra Bobak Total critical care time: 35 minutes Critical care time was exclusive of separately billable procedures and treating other patients. Critical care was necessary to treat or prevent imminent or life-threatening deterioration. Critical care was time spent personally by me on the following activities: development of treatment plan with patient and/or surrogate as well as nursing, discussions with consultants, evaluation of patient's response to treatment, examination of patient, obtaining history from patient or surrogate, ordering and performing treatments and interventions, ordering and review of laboratory studies, ordering and review of radiographic studies, pulse oximetry and re-evaluation of patient's condition.    Medications Ordered in ED Medications  ondansetron (ZOFRAN) 4 MG/2ML injection (has no administration in time range)  sodium chloride 0.9 % bolus 1,000 mL (has no administration in time range)  0.9 %  sodium chloride infusion (has no administration in time range)  aspirin chewable tablet 324 mg (has no  administration in time range)  heparin injection 4,000 Units (has no administration in time range)  morphine 2 MG/ML injection (has no administration in time range)  ondansetron (ZOFRAN) 4 MG/2ML injection (has no administration in time range)  morphine 2 MG/ML injection (has no administration in time range)    ED Course  I have reviewed the triage vital signs and the nursing notes.  Pertinent labs & imaging results that were available during my care of the patient were reviewed by me and considered in my medical decision making (see chart for details).    MDM Rules/Calculators/A&P                          Kyran Connaughton is a 55 y.o. male with a past medical history sniffing for diabetes who presents with sudden onset chest pain.  He describes as a pressure in his central chest.  He was working on his car approximate 30 minutes prior to arrival when it began.  He reports it  is an 8 out of 10 currently now and has improved slightly since onset.  He has no history of cardiac disease himself.  Patient was seen in triage by the [facility] provider and was quickly recognized that he was in acute distress.   Patient quickly brought back to exam room where he had an EKG showing acute inferior STEMI.  Cardiology called and STEMI activated.  Patient reports no recent fevers, chills but does report some exertional shortness of breath and decrease in stamina recently.  Today was the first time he had the chest discomfort like this.  Cardiology returned the call and will come see him now.  We will start heparin.  Patient was given aspirin.  As this appears to be inferior STEMI read, will be careful with nitroglycerin initially and will give him some fluids.  COVID test was sent for admission.  Patient will get the other STEMI work-up including chest x-ray and labs.  Patient was seen by cardiology shortly to help aid with disposition.   Cardiology will take directly to Cath Lab and admit for further management  of STEMI.   Final Clinical Impression(s) / ED Diagnoses Final diagnoses:  ST elevation myocardial infarction (STEMI), unspecified artery (HCC)     Clinical Impression: 1. ST elevation myocardial infarction (STEMI), unspecified artery (HCC)     Disposition: Admit  This note was prepared with assistance of Dragon voice recognition software. Occasional wrong-word or sound-a-like substitutions may have occurred due to the inherent limitations of voice recognition software.     Avani Sensabaugh, Canary Brim, MD 08/14/20 1254

## 2020-08-14 NOTE — H&P (Signed)
Cardiology Admission History and Physical:   Patient ID: Jason Joseph MRN: [id number]; DOB: Jun 22, 1965   Admission date: 08/14/2020  PCP:  Pearson Grippe, MD   [facility] Providers Cardiologist:  None        Chief Complaint:  Chest pain   History of Present Illness:   [name] is a 55 yo male with documented diabetes but he denies this. He is presenting to the ED with c/o of chest pain starting around 11am. He has been having chest pain on/off for several weeks. He does not smoke cigarettes. He does use marijuana. EKG with inferior ST elevation. Code STEMI called by ED staff. At the time of my examination, he is chest pain free. Repeat EKG with resolution of ST elevation. Emergent cardiac cath planned.    Past Medical History:  Diagnosis Date  . Diabetes mellitus without complication (HCC)     No past surgical history on file.   Medications Prior to Admission: Prior to Admission medications   Medication Sig Start Date End Date Taking? Authorizing Provider  aspirin 81 MG tablet Take 81 mg by mouth daily.    [provider]  FLUoxetine (PROZAC) 10 MG tablet Take 10 mg by mouth daily.    [provider]  metFORMIN (GLUCOPHAGE) 500 MG tablet Take by mouth 2 (two) times daily with a meal.    [provider]     Allergies:   No Known Allergies  Social History:   Social History   Socioeconomic History  . Marital status: Married    Spouse name: Not on file  . Number of children: Not on file  . Years of education: Not on file  . Highest education level: Not on file  Occupational History  . Not on file  Tobacco Use  . Smoking status: Former Smoker    Start date: 04/15/2011  . Smokeless tobacco: Never Used  Substance and Sexual Activity  . Alcohol use: Never  . Drug use: Yes    Types: Marijuana  . Sexual activity: Not on file  Other Topics Concern  . Not on file  Social History Narrative  . Not on file   Social Determinants of Health    Financial Resource Strain: Not on file  Food Insecurity: Not on file  Transportation Needs: Not on file  Physical Activity: Not on file  Stress: Not on file  Social Connections: Not on file  Intimate Partner Violence: Not on file    Family History:   The patient's family history includes Heart attack in his father.    ROS:  Please see the history of present illness.  All other ROS reviewed and negative.     Physical Exam/Data:   Vitals:   08/14/20 1200 08/14/20 1210 08/14/20 1216 08/14/20 1220  BP: (!) 188/147     Pulse:      Resp: 20     Temp:    97.8 F (36.6 C)  TempSrc:   Oral Oral  SpO2:   100%   Weight:  74.8 kg    Height:  5\' 6"  (1.676 m)     No intake or output data in the 24 hours ending 08/14/20 1245 Last 3 Weights 08/14/2020 12/14/2011  Weight (lbs) 165 lb 200 lb  Weight (kg) 74.844 kg 90.719 kg     Body mass index is 26.63 kg/m.  General:  Well nourished, well developed, appears uncomfortable HEENT: normal Lymph: no adenopathy Neck: no JVD Endocrine:  No thryomegaly Vascular: No carotid bruits;  FA pulses 2+ bilaterally without bruits  Cardiac:  normal S1, S2; RRR; no murmur  Lungs:  clear to auscultation bilaterally, no wheezing, rhonchi or rales  Abd: soft, nontender, no hepatomegaly  Ext: no LE edema Musculoskeletal:  No deformities, BUE and BLE strength normal and equal Skin: warm and dry  Neuro:  CNs 2-12 intact, no focal abnormalities noted Psych:  Normal affect    EKG:  The ECG that was done was personally reviewed and demonstrates sinus, inferior STEMI  Relevant CV Studies:   Laboratory Data:  High Sensitivity Troponin:  No results for input(s): TROPONINIHS in the last 720 hours.    ChemistryNo results for input(s): NA, K, CL, CO2, GLUCOSE, BUN, CREATININE, CALCIUM, GFRNONAA, GFRAA, ANIONGAP in the last 168 hours.  No results for input(s): PROT, ALBUMIN, AST, ALT, ALKPHOS, BILITOT in the last 168 hours. HematologyNo results for  input(s): WBC, RBC, HGB, HCT, MCV, MCH, MCHC, RDW, PLT in the last 168 hours. BNPNo results for input(s): BNP, PROBNP in the last 168 hours.  DDimer No results for input(s): DDIMER in the last 168 hours.   Radiology/Studies:  No results found.   Assessment and Plan:   1. Acute inferior STEMI: Plan emergent cardiac cath. Further plans to follow   Risk Assessment/Risk Scores:    TIMI Risk Score for ST  Elevation MI:   The patient's TIMI risk score is 1 1, which indicates a 1.61.6 % risk of all cause mortality at 30 days.        Severity of Illness: The appropriate patient status for this patient is INPATIENT. Inpatient status is judged to be reasonable and necessary in order to provide the required intensity of service to ensure the patient's safety. The patient's presenting symptoms, physical exam findings, and initial radiographic and laboratory data in the context of their chronic comorbidities is felt to place them at high risk for further clinical deterioration. Furthermore, it is not anticipated that the patient will be medically stable for discharge from the hospital within 2 midnights of admission. The following factors support the patient status of inpatient.   " The patient's presenting symptoms include chest pain,STEMI.   * I certify that at the point of admission it is my clinical judgment that the patient will require inpatient hospital care spanning beyond 2 midnights from the point of admission due to high intensity of service, high risk for further deterioration and high frequency of surveillance required.*    For questions or updates, please contact [facility] Please consult [email or web address] for contact info under     Signed, Verne Carrow, MD  08/14/2020 12:45 PM

## 2020-08-14 NOTE — Progress Notes (Signed)
Chaplain responded to STEMI.  Wife is bedside, supporting husband who appears to be in significant pain.  Chaplain offered hospitality and support.  Please contact if support is needed.  Theodoro Parma [phone]    08/14/20 1212  Clinical Encounter Type  Visited With Patient and family together  Visit Type Initial (STEMI)  Advance Directives (For Healthcare)  Does Patient Have a Medical Advance Directive? No  Would patient like information on creating a medical advance directive? No - Patient declined  Mental Health Advance Directives  Does Patient Have a Mental Health Advance Directive? No  Would patient like information on creating a mental health advance directive? No - Patient declined

## 2020-08-14 NOTE — ED Triage Notes (Signed)
Pt presents POV with acute onset of central CP and SOB starting approx 1 hour ago while working on his car. No hx of MI    Pt taken to room 8 after Clydie Braun PA confirmed STEMI in triage. Pt placed on Zoll, monitor, cycling BP, applied 2LNC    Pt received  5000 units Heparin 324 mg ASA  4mg  Morphine 4mg  Zofran

## 2020-08-15 ENCOUNTER — Encounter (HOSPITAL_COMMUNITY): Payer: Self-pay | Admitting: Cardiovascular Disease

## 2020-08-15 ENCOUNTER — Inpatient Hospital Stay (HOSPITAL_COMMUNITY): Payer: 59

## 2020-08-15 DIAGNOSIS — I2510 Atherosclerotic heart disease of native coronary artery without angina pectoris: Secondary | ICD-10-CM

## 2020-08-15 DIAGNOSIS — I2119 ST elevation (STEMI) myocardial infarction involving other coronary artery of inferior wall: Principal | ICD-10-CM

## 2020-08-15 LAB — POCT I-STAT, CHEM 8
BUN: 14 mg/dL (ref 6–20)
Calcium, Ion: 1.13 mmol/L — ABNORMAL LOW (ref 1.15–1.40)
Chloride: 105 mmol/L (ref 98–111)
Creatinine, Ser: 0.9 mg/dL (ref 0.61–1.24)
Glucose, Bld: 213 mg/dL — ABNORMAL HIGH (ref 70–99)
HCT: 39 % (ref 39.0–52.0)
Hemoglobin: 13.3 g/dL (ref 13.0–17.0)
Potassium: 4 mmol/L (ref 3.5–5.1)
Sodium: 138 mmol/L (ref 135–145)
TCO2: 18 mmol/L — ABNORMAL LOW (ref 22–32)

## 2020-08-15 LAB — BASIC METABOLIC PANEL
Anion gap: 8 (ref 5–15)
BUN: 9 mg/dL (ref 6–20)
CO2: 20 mmol/L — ABNORMAL LOW (ref 22–32)
Calcium: 8.3 mg/dL — ABNORMAL LOW (ref 8.9–10.3)
Chloride: 108 mmol/L (ref 98–111)
Creatinine, Ser: 0.83 mg/dL (ref 0.61–1.24)
GFR, Estimated: >60 mL/min (ref >60)
Glucose, Bld: 134 mg/dL — ABNORMAL HIGH (ref 70–99)
Potassium: 3.6 mmol/L (ref 3.5–5.1)
Sodium: 136 mmol/L (ref 135–145)

## 2020-08-15 LAB — ECHOCARDIOGRAM COMPLETE
AR max vel: 2.75 cm2
AV Area VTI: 3.2 cm2
AV Area mean vel: 2.72 cm2
AV Mean grad: 5 mmHg
AV Peak grad: 11 mmHg
Ao pk vel: 1.66 m/s
Height: 66 in
S' Lateral: 3 cm
Weight: 2640 oz

## 2020-08-15 LAB — CBC
HCT: 41.1 % (ref 39.0–52.0)
Hemoglobin: 14.5 g/dL (ref 13.0–17.0)
MCH: 32.5 pg (ref 26.0–34.0)
MCHC: 35.3 g/dL (ref 30.0–36.0)
MCV: 92.2 fL (ref 80.0–100.0)
Platelets: 274 10*3/uL (ref 150–400)
RBC: 4.46 MIL/uL (ref 4.22–5.81)
RDW: 12.2 % (ref 11.5–15.5)
WBC: 10.2 10*3/uL (ref 4.0–10.5)
nRBC: 0 % (ref 0.0–0.2)

## 2020-08-15 LAB — HEPATIC FUNCTION PANEL
ALT: 47 U/L — ABNORMAL HIGH (ref 0–44)
AST: 49 U/L — ABNORMAL HIGH (ref 15–41)
Albumin: 3.4 g/dL — ABNORMAL LOW (ref 3.5–5.0)
Alkaline Phosphatase: 38 U/L (ref 38–126)
Bilirubin, Direct: <0.1 mg/dL (ref 0.0–0.2)
Total Bilirubin: 0.7 mg/dL (ref 0.3–1.2)
Total Protein: 6.1 g/dL — ABNORMAL LOW (ref 6.5–8.1)

## 2020-08-15 LAB — POCT ACTIVATED CLOTTING TIME
Activated Clotting Time: 279 seconds
Activated Clotting Time: 440 seconds

## 2020-08-15 LAB — GLUCOSE, CAPILLARY
Glucose-Capillary: 155 mg/dL — ABNORMAL HIGH (ref 70–99)
Glucose-Capillary: 164 mg/dL — ABNORMAL HIGH (ref 70–99)
Glucose-Capillary: 165 mg/dL — ABNORMAL HIGH (ref 70–99)
Glucose-Capillary: 159 mg/dL — ABNORMAL HIGH (ref 70–99)

## 2020-08-15 LAB — TROPONIN I (HIGH SENSITIVITY): Troponin I (High Sensitivity): 3788 ng/L (ref <18)

## 2020-08-15 MED ORDER — LIVING WELL WITH DIABETES BOOK
Freq: Once | Status: AC
  Filled 2020-08-15: qty 1

## 2020-08-15 MED ORDER — ALUM & MAG HYDROXIDE-SIMETH 200-200-20 MG/5ML PO SUSP
30.0000 mL | ORAL | Status: DC | PRN
  Administered 2020-08-15: 30 mL via ORAL
  Filled 2020-08-15: qty 30

## 2020-08-15 MED ORDER — CHLORHEXIDINE GLUCONATE CLOTH 2 % EX PADS
6.0000 | MEDICATED_PAD | Freq: Every day | CUTANEOUS | Status: DC
  Administered 2020-08-15 – 2020-08-16 (×2): 6 via TOPICAL

## 2020-08-15 NOTE — Progress Notes (Signed)
Patient with c/o heartburn feeling and possibly a bit of chest pressure.  Had 2 stents placed yesterday.  Maalox given and EKG obtained.  Patient reports feeling better now that he is sitting up. Will continue to observe

## 2020-08-15 NOTE — Progress Notes (Signed)
CARDIAC REHAB PHASE I   PRE:  Rate/Rhythm: 76 SR    BP: sitting 152/78    SaO2: 97 RA  MODE:  Ambulation: 370 ft   POST:  Rate/Rhythm: 77 SR    BP: sitting 161/82     SaO2: 98 RA  Tolerated walk well but c/o being tired and sore. Return to bed, BP elevated. Discussed MI, stent, brilinta, restrictions, diet, exercise, NTG and CRPII. Pt receptive. He was on metformin for "preDM" but stopped it due to feeling his CBG was too low. Encouraged him to discuss with his PCP. He could also benefit from DM teaching since his A1C is 7.3.Will refer to G'sO CRPII.  [phone]   Harriet Masson CES, ACSM 08/15/2020 10:55 AM

## 2020-08-15 NOTE — Progress Notes (Signed)
Patient's wife is concerned about small (approximately 1 cm x 1 cm) bruise/bump on on his left shoulder. Explained to patient and family that he will be more prone to bruising with brilinta and aspirin. Will mention to MD when rounding.

## 2020-08-15 NOTE — TOC Benefit Eligibility Note (Signed)
Transition of Care [facility]) Benefit Eligibility Note    Patient Details  Name: Jason Joseph MRN: [id number] Date of Birth: 08-Jan-1966   Medication/Dose: [place] Pepper 10mg  qd and Farxiga 10mg  qd  Covered?: Yes     Prescription Coverage Preferred Pharmacy: 04-06-2002 with Person/Company/Phone Number:: [employer]  Co-Pay: both have co-pay of $160 for 30 day retail each  Prior Approval: No     Additional Notes: patient is elegible for use of RX coupon card    [phone] Phone Number: 08/15/2020, 3:31 PM

## 2020-08-15 NOTE — Progress Notes (Signed)
Inpatient Diabetes Program Recommendations  AACE/ADA: New Consensus Statement on Inpatient Glycemic Control (2015)  Target Ranges:  Prepandial:   less than 140 mg/dL      Peak postprandial:   less than 180 mg/dL (1-2 hours)      Critically ill patients:  140 - 180 mg/dL   Results for Jason Joseph, Jason Joseph (MRN [id number]) as of 08/15/2020 13:11  Ref. Range 08/15/2020 06:28 08/15/2020 11:16  Glucose-Capillary Latest Ref Range: 70 - 99 mg/dL 440 (H) 347 (H)   Results for Jason Joseph, Jason Joseph (MRN [id number]) as of 08/15/2020 13:11  Ref. Range 08/14/2020 14:49  Hemoglobin A1C Latest Ref Range: 4.8 - 5.6 % 7.3 (H)     Admit STEMI  History: Pre-Diabetes  Home: Metformin 500 BID (stopped taking summer 2021)   Current Orders: Novolog 0-9 units TID    CBGs OK.   MD- May consider adding Jardiance 10 mg Daily for home when pt ready for discharge     Spoke w/ pt and wife at bedside this afternoon.  Pt told me his PCP Dr. Selena Batten with [facility] told him he had Pre-diabetes and put pt on Metformin.  Pt told me he was checking CBGs at home and was having AM CBGs down in the 70s so he decided to stop the Metformin last summer 2021.  His PCP Dr. Selena Batten is now out of practice due to illness and pt and wife told me they need to get pt established with another PCP in that office.  Has CBG meter at home and checks TID.  Sees CBG in the 200s occasionally but most numbers are 130 or less.    Discussed A1C results with pt and wife and explained what an A1C is, basic pathophysiology of DM Type 2, basic home care, basic diabetes diet nutrition principles, importance of checking CBGs and maintaining good CBG control to prevent long-term and short-term complications.  Also reviewed blood sugar goals and A1c goals for home.    Also discussed with pt and wife that the Cardiology team has mentioned the possibility of starting SGLT-2 for home at time of d/c.  Explained what SGLT-2 drugs are, how they work, side effects  etc.  Pt open and wiling to try this med if needed.  RNs to provide ongoing basic DM education at bedside with this patient.  Have ordered educational booklet.  Pt declines visit with RD at this time b/c he would like to focus on the heart healthy diet since he just had an MI.  We discussed the importance of monitoring carbohydrate intake and pt and wife told me they understand how carbs affect CBGs and they are actively trying to eat less carbs at home.    --Will follow patient during hospitalization--  Ambrose Finland RN, MSN, CDE Diabetes Coordinator Inpatient Glycemic Control Team Team Pager: [phone] (8a-5p)

## 2020-08-15 NOTE — Progress Notes (Addendum)
Progress Note  Patient Name: Jason Joseph Date of Encounter: 08/15/2020  [facility] Cardiologist: None   Subjective   No substernal chest pain at rest.  Mild "soreness" with deep inspiration.  No shortness of breath.  Inpatient Medications    Scheduled Meds: . aspirin  81 mg Oral Daily  . atorvastatin  80 mg Oral Daily  . Chlorhexidine Gluconate Cloth  6 each Topical Daily  . heparin  5,000 Units Intravenous Once  . insulin aspart  0-9 Units Subcutaneous TID WC  . metoprolol tartrate  25 mg Oral BID  . sodium chloride flush  3 mL Intravenous Q12H  . ticagrelor  90 mg Oral BID   Continuous Infusions: . sodium chloride     PRN Meds: sodium chloride, acetaminophen, morphine injection, ondansetron (ZOFRAN) IV, oxyCODONE, sodium chloride flush   Vital Signs    Vitals:   08/15/20 0500 08/15/20 0600 08/15/20 0700 08/15/20 0745  BP: (!) 142/82 (!) 158/79 139/84   Pulse: 71 63 73   Resp: (!) 22 (!) 24 15   Temp:    98.3 F (36.8 C)  TempSrc:    Oral  SpO2: 97% 97% 98%   Weight:      Height:        Intake/Output Summary (Last 24 hours) at 08/15/2020 3825 Last data filed at 08/15/2020 0600 Gross per 24 hour  Intake 1261.04 ml  Output 1400 ml  Net -138.96 ml   Last 3 Weights 08/14/2020 12/14/2011  Weight (lbs) 165 lb 200 lb  Weight (kg) 74.844 kg 90.719 kg      Telemetry    Sinus rhythm, no significant arrhythmia - Personally Reviewed  ECG    Normal sinus rhythm 64 bpm, inferior infarct age undetermined - Personally Reviewed  Physical Exam  Alert, oriented, in no distress GEN: No acute distress.   Neck: No JVD Cardiac: RRR, no murmurs, rubs, or gallops.  Respiratory: Clear to auscultation bilaterally. GI: Soft, nontender, non-distended  MS: No edema; No deformity.  Right radial site clear. Neuro:  Nonfocal  Psych: Normal affect   Labs    High Sensitivity Troponin:   Recent Labs  Lab 08/14/20 1203 08/14/20 1449 08/14/20 1820 08/14/20 2238   TROPONINIHS 9 794* 2,137* 3,788*      Chemistry Recent Labs  Lab 08/14/20 1203 08/15/20 0028  NA 137 136  K 3.8 3.6  CL 104 108  CO2 20* 20*  GLUCOSE 190* 134*  BUN 15 9  CREATININE 1.14 0.83  CALCIUM 9.7 8.3*  PROT 7.7 6.1*  ALBUMIN 4.3 3.4*  AST 39 49*  ALT 55* 47*  ALKPHOS 46 38  BILITOT 1.0 0.7  GFRNONAA >60 >60  ANIONGAP 13 8     Hematology Recent Labs  Lab 08/14/20 1203 08/15/20 0028  WBC 14.6* 10.2  RBC 4.93 4.46  HGB 16.2 14.5  HCT 45.7 41.1  MCV 92.7 92.2  MCH 32.9 32.5  MCHC 35.4 35.3  RDW 11.9 12.2  PLT 395 274    BNPNo results for input(s): BNP, PROBNP in the last 168 hours.   DDimer No results for input(s): DDIMER in the last 168 hours.   Radiology    CARDIAC CATHETERIZATION  Result Date: 08/14/2020  Prox RCA lesion is 90% stenosed.  Dist RCA lesion is 99% stenosed.  Mid LAD lesion is 60% stenosed.  Mid Cx to Dist Cx lesion is 50% stenosed.  A drug-eluting stent was successfully placed using a SYNERGY XD 3.50X20.  Post intervention, there is  a 0% residual stenosis.  A drug-eluting stent was successfully placed using a SYNERGY XD 3.0X32.  Post intervention, there is a 0% residual stenosis.  1. Inferior STEMI secondary to thrombotic stenosis of the distal RCA. Severe stenosis mid RCA as well. Successful PTCA/DES x 1 distal RCA and successful PTCA/DES x 1 mid RCA 2. Moderate stenosis in the mid LAD and mid Circumflex. These lesions do not appear to be flow limiting. Recommendations: Will admit to the ICU. Continue DAPT with ASA and Brilinta for one year. Will start a high intensity statin and a beta blocker. SSI for coverage. Echo to assess LVEF. Aggrastat drip for 2 hours post PCI.   DG Chest Port 1 View  Result Date: 08/14/2020 CLINICAL DATA:  STEMI. EXAM: PORTABLE CHEST 1 VIEW COMPARISON:  06/25/2004 FINDINGS: The heart size and mediastinal contours are within normal limits. Both lungs are clear. The visualized skeletal structures are  unremarkable. IMPRESSION: No active disease. Electronically Signed   By: Norva Pavlov M.D.   On: 08/14/2020 12:48    Cardiac Studies   Cardiac catheterization images personally reviewed with findings outlined above.  Echocardiogram currently pending  Patient Profile     55 y.o. 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  Acute inferior STEMI: Status post primary PCI.  Progressing well in early post MI period.  Continue aspirin, ticagrelor, metoprolol, atorvastatin.  2.  Mixed hyperlipidemia: Total cholesterol 222, triglycerides 572, unable to calculate LDL but direct LDL is measured at 95.  Started on atorvastatin 80 mg.  3.  Type 2 diabetes: Hemoglobin A1c 7.3, patient not on any specific therapy.  Blood glucoses in hospital ranging 149-156.  Await 2D echo, consider SGLT2 inhibitor at discharge.  Disposition: Transfer to telemetry bed today, phase 1 cardiac rehab, 2D echo today, anticipate discharge tomorrow pending clinical progress.  For questions or updates, please contact [facility] Please consult [email or web address] for contact info under   Signed, Tonny Bollman, MD  08/15/2020, 8:33 AM

## 2020-08-16 ENCOUNTER — Other Ambulatory Visit (HOSPITAL_COMMUNITY): Payer: Self-pay

## 2020-08-16 DIAGNOSIS — E119 Type 2 diabetes mellitus without complications: Secondary | ICD-10-CM

## 2020-08-16 LAB — GLUCOSE, CAPILLARY: Glucose-Capillary: 147 mg/dL — ABNORMAL HIGH (ref 70–99)

## 2020-08-16 MED ORDER — METOPROLOL TARTRATE 25 MG PO TABS
25.0000 mg | ORAL_TABLET | Freq: Two times a day (BID) | ORAL | 2 refills | Status: DC
  Filled 2020-08-16: qty 180, 90d supply, fill #0

## 2020-08-16 MED ORDER — ATORVASTATIN CALCIUM 80 MG PO TABS
80.0000 mg | ORAL_TABLET | Freq: Every day | ORAL | 2 refills | Status: DC
  Filled 2020-08-16: qty 90, 90d supply, fill #0

## 2020-08-16 MED ORDER — TICAGRELOR 90 MG PO TABS
90.0000 mg | ORAL_TABLET | Freq: Two times a day (BID) | ORAL | 2 refills | Status: DC
  Filled 2020-08-16: qty 60, 30d supply, fill #0

## 2020-08-16 MED ORDER — METFORMIN HCL 500 MG PO TABS
500.0000 mg | ORAL_TABLET | Freq: Two times a day (BID) | ORAL | 1 refills | Status: DC
  Filled 2020-08-16: qty 180, 90d supply, fill #0

## 2020-08-16 MED ORDER — NITROGLYCERIN 0.4 MG SL SUBL
0.4000 mg | SUBLINGUAL_TABLET | SUBLINGUAL | 2 refills | Status: DC | PRN
  Filled 2020-08-16: qty 25, 1d supply, fill #0

## 2020-08-16 MED ORDER — ASPIRIN 81 MG PO CHEW
81.0000 mg | CHEWABLE_TABLET | Freq: Every day | ORAL | 2 refills | Status: AC
  Filled 2020-08-16: qty 90, 90d supply, fill #0

## 2020-08-16 NOTE — Discharge Instructions (Signed)
Heart Attack A heart attack occurs when blood and oxygen supply to the heart is cut off. A heart attack causes damage to the heart that cannot be fixed. A heart attack is also called a myocardial infarction, or MI. If you think you are having a heart attack, do not wait to see if the symptoms will go away. Get medical help right away. What are the causes? This condition may be caused by:  A fatty substance (plaque) in the blood vessels (arteries). This can block the flow of blood to the heart.  A blood clot in the blood vessels that go to the heart. The blood clot blocks blood flow.  Low blood pressure.  An abnormal heartbeat.  Some diseases, such as problems in red blood cells (anemia)orproblems in breathing (respiratory failure).  Tightening (spasm) of a blood vessel that cuts off blood to the heart.  A tear in a blood vessel of the heart.  High blood pressure.   What increases the risk? The following factors may make you more likely to develop this condition:  Aging. The older you are, the higher your risk.  Having a personal or family history of chest pain, heart attack, stroke, or narrowing of the arteries in the legs, arms, head, or stomach (peripheral artery disease).  Being male.  Smoking.  Not getting regular exercise.  Being overweight or obese.  Having high blood pressure.  Having high cholesterol.  Having diabetes.  Drinking too much alcohol.  Using illegal drugs, such as cocaine or methamphetamine. What are the signs or symptoms? Symptoms of this condition include:  Chest pain. It may feel like: ? Crushing or squeezing. ? Tightness, pressure, fullness, or heaviness.  Pain in the arm, neck, jaw, back, or upper body.  Shortness of breath.  Heartburn.  Upset stomach (indigestion).  Feeling like you may vomit (nauseous).  Cold sweats.  Feeling tired.  Sudden light-headedness. How is this treated? A heart attack must be treated as soon as  possible. Treatment may include:  Medicines to: ? Break up or dissolve blood clots. ? Thin blood and help prevent blood clots. ? Treat blood pressure. ? Improve blood flow to the heart. ? Reduce pain. ? Reduce cholesterol.  Procedures to widen a blocked artery and keep it open.  Open heart surgery.  Receiving oxygen.  Making your heart strong again (cardiac rehabilitation) through exercise, education, and counseling.   Follow these instructions at home: Medicines  Take over-the-counter and prescription medicines only as told by your doctor. You may need to take medicine: ? To keep your blood from clotting too easily. ? To control blood pressure. ? To lower cholesterol. ? To control heart rhythms.  Do not take these medicines unless your doctor says it is okay: ? NSAIDs, such as ibuprofen. ? Supplements that have vitamin A, vitamin E, or both. ? Hormone replacement therapy that has estrogen with or without progestin. Lifestyle  Do not use any products that have nicotine or tobacco, such as cigarettes, e-cigarettes, and chewing tobacco. If you need help quitting, ask your doctor.  Avoid secondhand smoke.  Exercise regularly. Ask your doctor about a cardiac rehab program.  Eat heart-healthy foods. Your doctor will tell you what foods to eat.  Stay at a healthy weight.  Lower your stress level.  Do not use illegal drugs.      Alcohol use  Do not drink alcohol if: ? Your doctor tells you not to drink. ? You are pregnant, may be pregnant, or   are planning to become pregnant.  If you drink alcohol: ? Limit how much you use to:  0-1 drink a day for women.  0-2 drinks a day for men. ? Know how much alcohol is in your drink. In the [place] one drink equals one 12 oz bottle of beer (355 mL), one 5 oz glass of wine (148 mL), or one 1 oz glass of hard liquor (44 mL). General instructions  Work with your doctor to treat other problems you may have, such as diabetes or  high blood pressure.  Get screened for depression. Get treatment if needed.  Keep your vaccines up to date. Get the flu shot (influenza vaccine) every year.  Keep all follow-up visits as told by your doctor. This is important. Contact a doctor if:  You feel very sad.  You have trouble doing your daily activities. Get help right away if:  You have sudden, unexplained discomfort in your chest, arms, back, neck, jaw, or upper body.  You have shortness of breath.  You have sudden sweating or clammy skin.  You feel like you may vomit.  You vomit.  You feel tired or weak.  You get light-headed or dizzy.  You feel your heart beating fast.  You feel your heart skipping beats.  You have blood pressure that is higher than 180/120. These symptoms may be an emergency. Do not wait to see if the symptoms will go away. Get medical help right away. Call your local emergency services (911 in the [place] Do not drive yourself to the hospital. Summary  A heart attack occurs when blood and oxygen supply to the heart is cut off.  Do not take NSAIDs unless your doctor says it is okay.  Do not smoke. Avoid secondhand smoke.  Exercise regularly. Ask your doctor about a cardiac rehab program. This information is not intended to replace advice given to you by your health care provider. Make sure you discuss any questions you have with your health care provider. Document Revised: 06/30/2018 Document Reviewed: 06/30/2018 [employer] Patient Education  2021 [employer]  

## 2020-08-16 NOTE — Discharge Summary (Signed)
Discharge Summary    Patient ID: Jason Joseph MRN: [id number]; DOB: November 09, 1965  Admit date: 08/14/2020 Discharge date: 08/16/2020  PCP:  Pearson Grippe, MD   [facility] Providers Cardiologist:   }     Discharge Diagnoses    Active Problems:   Acute ST elevation myocardial infarction (STEMI) of inferior wall (HCC)   Type 2 diabetes mellitus without complication, without long-term current use of insulin [facility])    Diagnostic Studies/Procedures     Left heart cath on 08/14/20:   Prox RCA lesion is 90% stenosed.  Dist RCA lesion is 99% stenosed.  Mid LAD lesion is 60% stenosed.  Mid Cx to Dist Cx lesion is 50% stenosed.  A drug-eluting stent was successfully placed using a SYNERGY XD 3.50X20.  Post intervention, there is a 0% residual stenosis.  A drug-eluting stent was successfully placed using a SYNERGY XD 3.0X32.  Post intervention, there is a 0% residual stenosis.   1. Inferior STEMI secondary to thrombotic stenosis of the distal RCA. Severe stenosis mid RCA as well. Successful PTCA/DES x 1 distal RCA and successful PTCA/DES x 1 mid RCA 2. Moderate stenosis in the mid LAD and mid Circumflex. These lesions do not appear to be flow limiting.   Recommendations: Will admit to the ICU. Continue DAPT with ASA and Brilinta for one year. Will start a high intensity statin and a beta blocker. SSI for coverage. Echo to assess LVEF. Aggrastat drip for 2 hours post PCI.    Echo on 08/15/20:  1. Akinesis of the inferior wall (base, mid). Left ventricular ejection  fraction, by estimation, is 55 to 60%. The left ventricle has normal  function. The left ventricle has no regional wall motion abnormalities.  Left ventricular diastolic parameters  were normal.  2. Right ventricular systolic function is normal. The right ventricular  size is normal.  3. The mitral valve is normal in structure. Trivial mitral valve  regurgitation.  4. Aortic valve regurgitation is not  visualized. Mild aortic valve  sclerosis is present, with no evidence of aortic valve stenosis.  _____________    History of Present Illness     Jason Joseph is a 55 y.o. male with hx of DM, who presented to ED 08/14/20 with c/o of chest pain starting around 11am. He had been having chest pain on/off for several weeks. He denied hx of DM. He does not smoke cigarettes. He does use marijuana. EKG with inferior ST elevation. Code STEMI called by ED staff. On examination, he was chest pain free. Repeat EKG with resolution of ST elevation. Emergent cardiac cath planned.   Hospital Course     Consultants N/A  Acute inferior STEMI: Patient status post primary PCI with drug-eluting stents in the right coronary artery.  He will continue on aspirin, ticagrelor, metoprolol, and atorvastatin.  5/16 echo with inferior wall akinesis and preserved overall LVEF of 60%. Follow up arranged in the office on 09/01/20. Radial site care discussed. Script sent.    Mixed hyperlipidemia: Started on atorvastatin 80 mg daily. Non-fasting lipid panel showed total cholesterol 222, triglycerides 572, unable to calculate LDL but direct LDL is measured at 95.  Lifestyle modification discussed. Script sent. Repeat lipid panel in 1 month outpatient, if triglycerides remains elevated, will add additional agents.    Type 2 diabetes: A1C  7.3%, Unable add Jardiance 10 mg daily given co-pay is 160 dollars monthly and patient can't afford.    Low glycemic diet/lifestyle modification discussed. Patient reports taking metformin  daily  at home in the past, will resume metformin 500mg  BID (48 hours after cardiac cath on 5/18), script given, repeat A1C in 3 month. Advised patient to see PCP.      Did the patient have an acute coronary syndrome (MI, NSTEMI, STEMI, etc) this admission?:  Yes                               AHA/ACC Clinical Performance & Quality Measures: 1. Aspirin prescribed? - Yes 2. ADP Receptor Inhibitor  (Plavix/Clopidogrel, Brilinta/Ticagrelor or Effient/Prasugrel) prescribed (includes medically managed patients)? - Yes 3. Beta Blocker prescribed? - Yes 4. High Intensity Statin (Lipitor 40-80mg  or Crestor 20-40mg ) prescribed? - Yes 5. EF assessed during THIS hospitalization? - Yes 6. For EF <40%, was ACEI/ARB prescribed? - Not Applicable (EF >/= 40%) 7. For EF <40%, Aldosterone Antagonist (Spironolactone or Eplerenone) prescribed? - Not Applicable (EF >/= 40%) 8. Cardiac Rehab Phase II ordered (including medically managed patients)? - Yes       _____________  Discharge Vitals Blood pressure (!) 152/82, pulse 79, temperature 98.7 F (37.1 C), temperature source Oral, resp. rate 18, height 5\' 6"  (1.676 m), weight 74.8 kg, SpO2 96 %.  Filed Weights   08/14/20 1210  Weight: 74.8 kg    Labs & Radiologic Studies    CBC Recent Labs    08/14/20 1203 08/14/20 1303 08/15/20 0028  WBC 14.6*  --  10.2  NEUTROABS 7.5  --   --   HGB 16.2 13.3 14.5  HCT 45.7 39.0 41.1  MCV 92.7  --  92.2  PLT 395  --  274   Basic Metabolic Panel Recent Labs    08/16/20 1203 08/14/20 1303 08/15/20 0028  NA 137 138 136  K 3.8 4.0 3.6  CL 104 105 108  CO2 20*  --  20*  GLUCOSE 190* 213* 134*  BUN 15 14 9   CREATININE 1.14 0.90 0.83  CALCIUM 9.7  --  8.3*   Liver Function Tests Recent Labs    08/14/20 1203 08/15/20 0028  AST 39 49*  ALT 55* 47*  ALKPHOS 46 38  BILITOT 1.0 0.7  PROT 7.7 6.1*  ALBUMIN 4.3 3.4*   No results for input(s): LIPASE, AMYLASE in the last 72 hours. High Sensitivity Troponin:   Recent Labs  Lab 08/14/20 1203 08/14/20 1449 08/14/20 1820 08/14/20 2238  TROPONINIHS 9 794* 2,137* 3,788*    BNP Invalid input(s): POCBNP D-Dimer No results for input(s): DDIMER in the last 72 hours. Hemoglobin A1C Recent Labs    08/14/20 1449  HGBA1C 7.3*   Fasting Lipid Panel Recent Labs    08/14/20 1203  CHOL 222*  HDL 30*  LDLCALC UNABLE TO CALCULATE IF  TRIGLYCERIDE OVER 400 mg/dL  TRIG 02-12-1976*  CHOLHDL 7.4  LDLDIRECT 95.0   Thyroid Function Tests No results for input(s): TSH, T4TOTAL, T3FREE, THYROIDAB in the last 72 hours.  Invalid input(s): FREET3 _____________  CARDIAC CATHETERIZATION  Result Date: 08/14/2020  Prox RCA lesion is 90% stenosed.  Dist RCA lesion is 99% stenosed.  Mid LAD lesion is 60% stenosed.  Mid Cx to Dist Cx lesion is 50% stenosed.  A drug-eluting stent was successfully placed using a SYNERGY XD 3.50X20.  Post intervention, there is a 0% residual stenosis.  A drug-eluting stent was successfully placed using a SYNERGY XD 3.0X32.  Post intervention, there is a 0% residual stenosis.  1. Inferior STEMI secondary to thrombotic stenosis of the  distal RCA. Severe stenosis mid RCA as well. Successful PTCA/DES x 1 distal RCA and successful PTCA/DES x 1 mid RCA 2. Moderate stenosis in the mid LAD and mid Circumflex. These lesions do not appear to be flow limiting. Recommendations: Will admit to the ICU. Continue DAPT with ASA and Brilinta for one year. Will start a high intensity statin and a beta blocker. SSI for coverage. Echo to assess LVEF. Aggrastat drip for 2 hours post PCI.   DG Chest Port 1 View  Result Date: 08/14/2020 CLINICAL DATA:  STEMI. EXAM: PORTABLE CHEST 1 VIEW COMPARISON:  06/25/2004 FINDINGS: The heart size and mediastinal contours are within normal limits. Both lungs are clear. The visualized skeletal structures are unremarkable. IMPRESSION: No active disease. Electronically Signed   By: Norva Pavlov M.D.   On: 08/14/2020 12:48   ECHOCARDIOGRAM COMPLETE  Result Date: 08/15/2020    ECHOCARDIOGRAM REPORT   Patient Name:   Jason Joseph Date of Exam: 08/15/2020 Medical Rec #:  [id number]   Height:       66.0 in Accession #:    [id number]  Weight:       165.0 lb Date of Birth:  03-06-1966    BSA:          1.843 m Patient Age:    55 years    BP:           143/80 mmHg Patient Gender: M           HR:           69  bpm. Exam Location:  Inpatient Procedure: 2D Echo, Cardiac Doppler and Color Doppler Indications:    CAD Native Vessel I25.10  History:        Patient has no prior history of Echocardiogram examinations. CAD                 and Acute MI.  Sonographer:    Roosvelt Maser Referring Phys: [id number] CHRISTOPHER D MCALHANY IMPRESSIONS  1. Akinesis of the inferior wall (base, mid). Left ventricular ejection fraction, by estimation, is 55 to 60%. The left ventricle has normal function. The left ventricle has no regional wall motion abnormalities. Left ventricular diastolic parameters were normal.  2. Right ventricular systolic function is normal. The right ventricular size is normal.  3. The mitral valve is normal in structure. Trivial mitral valve regurgitation.  4. Aortic valve regurgitation is not visualized. Mild aortic valve sclerosis is present, with no evidence of aortic valve stenosis. FINDINGS  Left Ventricle: Akinesis of the inferior wall (base, mid). Left ventricular ejection fraction, by estimation, is 55 to 60%. The left ventricle has normal function. The left ventricle has no regional wall motion abnormalities. The left ventricular internal cavity size was normal in size. There is no left ventricular hypertrophy. Left ventricular diastolic parameters were normal. Right Ventricle: The right ventricular size is normal. Right vetricular wall thickness was not assessed. Right ventricular systolic function is normal. Left Atrium: Left atrial size was normal in size. Right Atrium: Right atrial size was normal in size. Pericardium: There is no evidence of pericardial effusion. Mitral Valve: The mitral valve is normal in structure. Trivial mitral valve regurgitation. Tricuspid Valve: The tricuspid valve is normal in structure. Tricuspid valve regurgitation is not demonstrated. Aortic Valve: Aortic valve regurgitation is not visualized. Mild aortic valve sclerosis is present, with no evidence of aortic valve stenosis. Aortic  valve mean gradient measures 5.0 mmHg. Aortic valve peak gradient measures 11.0 mmHg. Aortic valve area,  by VTI measures 3.20 cm. Pulmonic Valve: The pulmonic valve was not well visualized. Pulmonic valve regurgitation is not visualized. Aorta: The aortic root is normal in size and structure. IAS/Shunts: No atrial level shunt detected by color flow Doppler.  LEFT VENTRICLE PLAX 2D LVIDd:         4.30 cm  Diastology LVIDs:         3.00 cm  LV e' medial:  7.40 cm/s LV PW:         1.00 cm  LV e' lateral: 11.60 cm/s LV IVS:        1.00 cm LVOT diam:     2.40 cm LV SV:         90 LV SV Index:   49 LVOT Area:     4.52 cm  LEFT ATRIUM             Index       RIGHT ATRIUM           Index LA diam:        3.60 cm 1.95 cm/m  RA Area:     16.30 cm LA Vol (A2C):   43.2 ml 23.44 ml/m RA Volume:   41.90 ml  22.74 ml/m LA Vol (A4C):   43.3 ml 23.50 ml/m LA Biplane Vol: 43.9 ml 23.82 ml/m  AORTIC VALVE AV Area (Vmax):    2.75 cm AV Area (Vmean):   2.72 cm AV Area (VTI):     3.20 cm AV Vmax:           166.00 cm/s AV Vmean:          102.000 cm/s AV VTI:            0.280 m AV Peak Grad:      11.0 mmHg AV Mean Grad:      5.0 mmHg LVOT Vmax:         101.00 cm/s LVOT Vmean:        61.300 cm/s LVOT VTI:          0.198 m LVOT/AV VTI ratio: 0.71  AORTA Ao Root diam: 4.00 cm  SHUNTS Systemic VTI:  0.20 m Systemic Diam: 2.40 cm Dietrich Pates MD Electronically signed by Dietrich Pates MD Signature Date/Time: 08/15/2020/6:22:13 PM    Final    Disposition   Pt is being discharged home today in good condition.  Follow-up Plans & Appointments     Follow-up Information    Corrin Parker, PA-C Follow up on 09/01/2020.   Specialties: Advice worker, Cardiology Why: 11:15AM  Contact information: [address] [place] [address] [phone]              Discharge Instructions    Amb Referral to Cardiac Rehabilitation   Complete by: As directed    Diagnosis:  Coronary Stents STEMI PTCA     After  initial evaluation and assessments completed: Virtual Based Care may be provided alone or in conjunction with Phase 2 Cardiac Rehab based on patient barriers.: Yes   Diet - low sodium heart healthy   Complete by: As directed    Discharge instructions   Complete by: As directed    Do not return to work until you follow up with [place] in the office.   PLEASE DO NOT MISS ANY DOSES OF YOUR BRILINTA/ASPIRIN!!!!! Also keep a log of you blood pressures and bring back to your follow up appt. Please call the office with any questions.   Patients taking blood  thinners should generally stay away from medicines like ibuprofen, Advil, Motrin, naproxen, and Aleve due to risk of stomach bleeding. You may take Tylenol as directed or talk to your primary doctor about alternatives.  PLEASE ENSURE THAT YOU DO NOT RUN OUT OF YOUR BRILINTA. This medication is very important to remain on for at least one year. IF you have issues obtaining this medication due to cost please CALL the office 3-5 business days prior to running out in order to prevent missing doses of this medication.      Radial Site Care  Refer to this sheet in the next few weeks. These instructions provide you with information on caring for yourself after your procedure. Your caregiver may also give you more specific instructions. Your treatment has been planned according to current medical practices, but problems sometimes occur. Call your caregiver if you have any problems or questions after your procedure.  HOME CARE INSTRUCTIONS  You may shower the day after the procedure.Remove the bandage (dressing) and gently wash the site with plain soap and water.Gently pat the site dry.  Do not apply powder or lotion to the site.  Do not submerge the affected site in water for 3 to 5 days.  Inspect the site at least twice daily.  Do not flex or bend the affected arm for 24 hours.  No lifting over 5 pounds (2.3 kg) for 5 days after your procedure.  Do not  drive home if you are discharged the same day of the procedure. Have someone else drive you.  You may drive 24 hours after the procedure unless otherwise instructed by your caregiver.   What to expect: Any bruising will usually fade within 1 to 2 weeks.  Blood that collects in the tissue (hematoma) may be painful to the touch. It should usually decrease in size and tenderness within 1 to 2 weeks.   SEEK IMMEDIATE MEDICAL CARE IF: You have unusual pain at the radial site.  You have redness, warmth, swelling, or pain at the radial site.  You have drainage (other than a small amount of blood on the dressing).  You have chills.  You have a fever or persistent symptoms for more than 72 hours.  You have a fever and your symptoms suddenly get worse.  Your arm becomes pale, cool, tingly, or numb.  You have heavy bleeding from the site. Hold pressure on the site.   Increase activity slowly   Complete by: As directed       Discharge Medications   Allergies as of 08/16/2020   No Known Allergies     Medication List    STOP taking these medications   acetaminophen 500 MG tablet Commonly known as: TYLENOL     TAKE these medications   aspirin 81 MG chewable tablet Chew 1 tablet (81 mg total) by mouth daily. Start taking on: Aug 17, 2020   atorvastatin 80 MG tablet Commonly known as: LIPITOR Take 1 tablet (80 mg total) by mouth daily. Start taking on: Aug 17, 2020   metFORMIN 500 MG tablet Commonly known as: Glucophage Take 1 tablet (500 mg total) by mouth 2 (two) times daily with a meal. Start taking on: Aug 17, 2020   metoprolol tartrate 25 MG tablet Commonly known as: LOPRESSOR Take 1 tablet (25 mg total) by mouth 2 (two) times daily.   nitroGLYCERIN 0.4 MG SL tablet Commonly known as: Nitrostat Place 1 tablet (0.4 mg total) under the tongue every 5 (five) minutes as needed  for chest pain.   ticagrelor 90 MG Tabs tablet Commonly known as: BRILINTA Take 1 tablet (90 mg  total) by mouth 2 (two) times daily.          Outstanding Labs/Studies     Duration of Discharge Encounter   Greater than 30 minutes including physician time.  Signed, Cyndi Bender, NP 08/16/2020, 10:46 AM

## 2020-08-16 NOTE — Progress Notes (Signed)
Mobility Specialist - Progress Note   08/16/20 1055  Mobility  Activity Ambulated in hall  Level of Assistance Independent  Assistive Device None  Distance Ambulated (ft) 400 ft  Mobility Ambulated with assistance in hallway  Mobility Response Tolerated well  Mobility performed by Mobility specialist  $Mobility charge 1 Mobility   Pt asx throughout ambulation. Pt to recliner after walk, call bell at side. VSS throughout.  Mamie Levers Mobility Specialist Mobility Specialist Phone: [phone]

## 2020-08-16 NOTE — TOC Benefit Eligibility Note (Signed)
Transition of Care [facility]) Benefit Eligibility Note    Patient Details  Name: Jason Joseph MRN: [id number] Date of Birth: Aug 21, 1965   Medication/Dose: Marden Noble  Covered?: Yes     Prescription Coverage Preferred Pharmacy: [facility] with Person/Company/Phone Number:: [employer] / [phone]  Co-Pay: $160 FOR 30 DAY RETAIL  Prior Approval: No  Deductible: Unmet ($8700/ NOTHING PAID)  Additional Notes: patient is elegible for use of RX coupon card    Orson Aloe Phone Number: 08/16/2020, 9:13 AM

## 2020-08-16 NOTE — TOC Transition Note (Signed)
Transition of Care [facility]) - CM/SW Discharge Note   Patient Details  Name: Jason Joseph MRN: [id number] Date of Birth: Apr 22, 1965  [facility]) CM/SW Contact:  Zenon Mayo, RN Phone Number: 08/16/2020, 1:21 PM   Clinical Narrative:    Patient is for dc today, he has left , NCM contacted him on the phone, he states he received the Brilinta medication from [facility].  NCM informed him of the co pay amout 160.00 , asked if he was given a coupon , he states no.  NCM informed him will have one mailed out to him.  Also informed him of his deductible that has to be met in order to use the 5 .00 each month coupon.  He was very Patent attorney.   Final next level of care: Home/Self Care Barriers to Discharge: No Barriers Identified   Patient Goals and CMS Choice        Discharge Placement                       Discharge Plan and Services                                     Social Determinants of Health (SDOH) Interventions     Readmission Risk Interventions No flowsheet data found.

## 2020-08-16 NOTE — Progress Notes (Signed)
Progress Note  Patient Name: Jason Joseph Date of Encounter: 08/16/2020  [facility] Cardiologist: None   Subjective   Feels well.  Mild discomfort with inspiration, otherwise no chest pain or pressure.  No shortness of breath.  Inpatient Medications    Scheduled Meds: . aspirin  81 mg Oral Daily  . atorvastatin  80 mg Oral Daily  . Chlorhexidine Gluconate Cloth  6 each Topical Daily  . insulin aspart  0-9 Units Subcutaneous TID WC  . metoprolol tartrate  25 mg Oral BID  . sodium chloride flush  3 mL Intravenous Q12H  . ticagrelor  90 mg Oral BID   Continuous Infusions: . sodium chloride     PRN Meds: sodium chloride, acetaminophen, alum & mag hydroxide-simeth, morphine injection, ondansetron (ZOFRAN) IV, oxyCODONE, sodium chloride flush   Vital Signs    Vitals:   08/16/20 0004 08/16/20 0300 08/16/20 0735 08/16/20 0902  BP: 137/68 133/83  (!) 152/82  Pulse: (!) 59 63  79  Resp: 16 18    Temp: 98.9 F (37.2 C) 98.3 F (36.8 C) 98.7 F (37.1 C)   TempSrc: Oral Oral Oral   SpO2: 96% 96%    Weight:      Height:        Intake/Output Summary (Last 24 hours) at 08/16/2020 0943 Last data filed at 08/15/2020 2109 Gross per 24 hour  Intake 3 ml  Output --  Net 3 ml   Last 3 Weights 08/14/2020 12/14/2011  Weight (lbs) 165 lb 200 lb  Weight (kg) 74.844 kg 90.719 kg      Telemetry    Normal sinus rhythm without significant arrhythmia- Personally Reviewed   Physical Exam  Alert, oriented, in no distress GEN: No acute distress.   Neck: No JVD Cardiac: RRR, no murmurs, rubs, or gallops.  Respiratory: Clear to auscultation bilaterally. GI: Soft, nontender, non-distended  MS: No edema; No deformity.  Right radial cath site is clear. Neuro:  Nonfocal  Psych: Normal affect   Labs    High Sensitivity Troponin:   Recent Labs  Lab 08/14/20 1203 08/14/20 1449 08/14/20 1820 08/14/20 2238  TROPONINIHS 9 794* 2,137* 3,788*      Chemistry Recent Labs  Lab  08/14/20 1203 08/14/20 1303 08/15/20 0028  NA 137 138 136  K 3.8 4.0 3.6  CL 104 105 108  CO2 20*  --  20*  GLUCOSE 190* 213* 134*  BUN 15 14 9   CREATININE 1.14 0.90 0.83  CALCIUM 9.7  --  8.3*  PROT 7.7  --  6.1*  ALBUMIN 4.3  --  3.4*  AST 39  --  49*  ALT 55*  --  47*  ALKPHOS 46  --  38  BILITOT 1.0  --  0.7  GFRNONAA >60  --  >60  ANIONGAP 13  --  8     Hematology Recent Labs  Lab 08/14/20 1203 08/14/20 1303 08/15/20 0028  WBC 14.6*  --  10.2  RBC 4.93  --  4.46  HGB 16.2 13.3 14.5  HCT 45.7 39.0 41.1  MCV 92.7  --  92.2  MCH 32.9  --  32.5  MCHC 35.4  --  35.3  RDW 11.9  --  12.2  PLT 395  --  274    BNPNo results for input(s): BNP, PROBNP in the last 168 hours.   DDimer No results for input(s): DDIMER in the last 168 hours.   Radiology    CARDIAC CATHETERIZATION  Result Date:  08/14/2020  Prox RCA lesion is 90% stenosed.  Dist RCA lesion is 99% stenosed.  Mid LAD lesion is 60% stenosed.  Mid Cx to Dist Cx lesion is 50% stenosed.  A drug-eluting stent was successfully placed using a SYNERGY XD 3.50X20.  Post intervention, there is a 0% residual stenosis.  A drug-eluting stent was successfully placed using a SYNERGY XD 3.0X32.  Post intervention, there is a 0% residual stenosis.  1. Inferior STEMI secondary to thrombotic stenosis of the distal RCA. Severe stenosis mid RCA as well. Successful PTCA/DES x 1 distal RCA and successful PTCA/DES x 1 mid RCA 2. Moderate stenosis in the mid LAD and mid Circumflex. These lesions do not appear to be flow limiting. Recommendations: Will admit to the ICU. Continue DAPT with ASA and Brilinta for one year. Will start a high intensity statin and a beta blocker. SSI for coverage. Echo to assess LVEF. Aggrastat drip for 2 hours post PCI.   DG Chest Port 1 View  Result Date: 08/14/2020 CLINICAL DATA:  STEMI. EXAM: PORTABLE CHEST 1 VIEW COMPARISON:  06/25/2004 FINDINGS: The heart size and mediastinal contours are within  normal limits. Both lungs are clear. The visualized skeletal structures are unremarkable. IMPRESSION: No active disease. Electronically Signed   By: Norva Pavlov M.D.   On: 08/14/2020 12:48   ECHOCARDIOGRAM COMPLETE  Result Date: 08/15/2020    ECHOCARDIOGRAM REPORT   Patient Name:   Jason Joseph Date of Exam: 08/15/2020 Medical Rec #:  [id number]   Height:       66.0 in Accession #:    [id number]  Weight:       165.0 lb Date of Birth:  1965/06/18    BSA:          1.843 m Patient Age:    55 years    BP:           143/80 mmHg Patient Gender: M           HR:           69 bpm. Exam Location:  Inpatient Procedure: 2D Echo, Cardiac Doppler and Color Doppler Indications:    CAD Native Vessel I25.10  History:        Patient has no prior history of Echocardiogram examinations. CAD                 and Acute MI.  Sonographer:    Roosvelt Maser Referring Phys: [id number] CHRISTOPHER D MCALHANY IMPRESSIONS  1. Akinesis of the inferior wall (base, mid). Left ventricular ejection fraction, by estimation, is 55 to 60%. The left ventricle has normal function. The left ventricle has no regional wall motion abnormalities. Left ventricular diastolic parameters were normal.  2. Right ventricular systolic function is normal. The right ventricular size is normal.  3. The mitral valve is normal in structure. Trivial mitral valve regurgitation.  4. Aortic valve regurgitation is not visualized. Mild aortic valve sclerosis is present, with no evidence of aortic valve stenosis. FINDINGS  Left Ventricle: Akinesis of the inferior wall (base, mid). Left ventricular ejection fraction, by estimation, is 55 to 60%. The left ventricle has normal function. The left ventricle has no regional wall motion abnormalities. The left ventricular internal cavity size was normal in size. There is no left ventricular hypertrophy. Left ventricular diastolic parameters were normal. Right Ventricle: The right ventricular size is normal. Right vetricular wall thickness  was not assessed. Right ventricular systolic function is normal. Left Atrium: Left atrial size was normal in size.  Right Atrium: Right atrial size was normal in size. Pericardium: There is no evidence of pericardial effusion. Mitral Valve: The mitral valve is normal in structure. Trivial mitral valve regurgitation. Tricuspid Valve: The tricuspid valve is normal in structure. Tricuspid valve regurgitation is not demonstrated. Aortic Valve: Aortic valve regurgitation is not visualized. Mild aortic valve sclerosis is present, with no evidence of aortic valve stenosis. Aortic valve mean gradient measures 5.0 mmHg. Aortic valve peak gradient measures 11.0 mmHg. Aortic valve area,  by VTI measures 3.20 cm. Pulmonic Valve: The pulmonic valve was not well visualized. Pulmonic valve regurgitation is not visualized. Aorta: The aortic root is normal in size and structure. IAS/Shunts: No atrial level shunt detected by color flow Doppler.  LEFT VENTRICLE PLAX 2D LVIDd:         4.30 cm  Diastology LVIDs:         3.00 cm  LV e' medial:  7.40 cm/s LV PW:         1.00 cm  LV e' lateral: 11.60 cm/s LV IVS:        1.00 cm LVOT diam:     2.40 cm LV SV:         90 LV SV Index:   49 LVOT Area:     4.52 cm  LEFT ATRIUM             Index       RIGHT ATRIUM           Index LA diam:        3.60 cm 1.95 cm/m  RA Area:     16.30 cm LA Vol (A2C):   43.2 ml 23.44 ml/m RA Volume:   41.90 ml  22.74 ml/m LA Vol (A4C):   43.3 ml 23.50 ml/m LA Biplane Vol: 43.9 ml 23.82 ml/m  AORTIC VALVE AV Area (Vmax):    2.75 cm AV Area (Vmean):   2.72 cm AV Area (VTI):     3.20 cm AV Vmax:           166.00 cm/s AV Vmean:          102.000 cm/s AV VTI:            0.280 m AV Peak Grad:      11.0 mmHg AV Mean Grad:      5.0 mmHg LVOT Vmax:         101.00 cm/s LVOT Vmean:        61.300 cm/s LVOT VTI:          0.198 m LVOT/AV VTI ratio: 0.71  AORTA Ao Root diam: 4.00 cm  SHUNTS Systemic VTI:  0.20 m Systemic Diam: 2.40 cm Dietrich Pates MD Electronically signed  by Dietrich Pates MD Signature Date/Time: 08/15/2020/6:22:13 PM    Final     Cardiac Studies   Echo 08/15/2020: IMPRESSIONS    1. Akinesis of the inferior wall (base, mid). Left ventricular ejection  fraction, by estimation, is 55 to 60%. The left ventricle has normal  function. The left ventricle has no regional wall motion abnormalities.  Left ventricular diastolic parameters  were normal.  2. Right ventricular systolic function is normal. The right ventricular  size is normal.  3. The mitral valve is normal in structure. Trivial mitral valve  regurgitation.  4. Aortic valve regurgitation is not visualized. Mild aortic valve  sclerosis is present, with no evidence of aortic valve stenosis.   Patient Profile     54 y.o. male presenting 08/14/2020 with  acute inferior STEMI, treated with primary PCI using 2 drug-eluting stents in the RCA, noted to have moderate nonobstructive disease in the left coronary system  Assessment & Plan    1.  Acute inferior STEMI: Patient status post primary PCI with drug-eluting stents in the right coronary artery.  He will continue on aspirin, ticagrelor, metoprolol, and atorvastatin.  Yesterday's echo reviewed with inferior wall akinesis and preserved overall LVEF of 60%.  2.  Mixed hyperlipidemia: Started on atorvastatin 80 mg daily.  Lipids as outlined in 5/16 note.  Lifestyle modification discussed.  3.  Type 2 diabetes: Would add Jardiance 10 mg daily if cost is not prohibitive.  Will ask care manager to evaluate.  Low glycemic diet/lifestyle modification discussed.  Disposition: Medically stable for hospital discharge today.  For questions or updates, please contact [facility] Please consult [email or web address] for contact info under        Signed, Tonny BollmanMichael Tarvis Blossom, MD  08/16/2020, 9:43 AM

## 2020-08-18 ENCOUNTER — Telehealth (HOSPITAL_COMMUNITY): Payer: Self-pay | Admitting: Pharmacist

## 2020-08-18 ENCOUNTER — Other Ambulatory Visit (HOSPITAL_COMMUNITY): Payer: Self-pay

## 2020-08-18 NOTE — Telephone Encounter (Signed)
Pharmacy Transitions of Care Follow-up Telephone Call  Date of discharge: 08/16/20 Discharge Diagnosis: STEMI w/stent  How have you been since you were released from the hospital?  Overall well, some SOB, tried to walk to mailbox in bedroom slippers and bruised foot on a stone.   Medication changes made at discharge:  - START:  Aspirin Low Dose   atorvastatin (LIPITOR)   Brilinta   metFORMIN (Glucophage)   metoprolol tartrate (LOPRESSOR)   nitroGLYCERIN (Nitrostat)   - STOPPED: APAP  - CHANGED: n/a  Medication changes verified by the patient? Yes    Medication Accessibility:  Home Pharmacy:  [facility], [place], [address]  Was the patient provided with refills on discharged medications? yes  Have all prescriptions been transferred from [facility] to home pharmacy?  Yes  Is the patient able to afford medications? Yes - has ins . Notable copays: brilinta - $85 copay on current ins plan.  Pt will have new ins in 1-2 months through his employer and expects copay to improve.  . Eligible patient assistance: yes, pt has been made aware of copay card     Medication Review:    TICAGRELOR (BRILINTA) Ticagrelor 90 mg BID initiated on 08/14/20.  - Educated patient on expected duration of therapy of aspirin with ticagrelor of 1 year. Advised patient that dose of ticagrelor may be discontinued or reduced after 1 year. Aspirin will likely be continued indefinitely. - Discussed importance of taking medication around the same time every day - Reviewed potential DDIs with patient - Advised patient of medications to avoid (NSAIDs, aspirin maintenance doses>100 mg daily) - Attempted to educate on using APAP as preferred OTC analgesic; however, AVS recommends pt d/c APAP.  PT is unaware of the reason and will follow-up about this at next cardiology visit.  - Emphasized importance of monitoring for signs and symptoms of bleeding (abnormal bruising, prolonged bleeding, nose bleeds, bleeding  from gums, discolored urine, black tarry stools)  - Educated patient to notify doctor if shortness of breath or abnormal heartbeat occur - Advised patient to alert all providers of antiplatelet therapy prior to starting a new medication or having a procedure    Follow-up Appointments:  PCP Hospital f/u appt confirmed?  No follow-up scheduled but pt is aware to schedule one for A1c in 3 mo.    Specialist Hospital f/u appt confirmed? Cardiology visit scheduled with Irene Limbo, [place] on 09/01/20 @ 11:15.   Referred to cardiac rehab - Pt is unable to attend cardiac rehab at [facility] because he is unwilling to receive the COVID vaccine.  He says this is a requirement of the cardiac rehab program.  I attempted to answer any questions or hear concerns that he may have regarding the vaccine.  Pt states his wife did not respond well to the vaccine and has tested positive for covid three times since vaccination.  He is uncomfortable getting the vaccine at this time.   If their condition worsens, is the pt aware to call PCP or go to the Emergency Dept.? Yes  Final Patient Assessment: Pt was pleasant with overall good understanding of his new medications.

## 2020-08-19 ENCOUNTER — Other Ambulatory Visit (HOSPITAL_COMMUNITY): Payer: Self-pay

## 2020-08-28 NOTE — Progress Notes (Signed)
Cardiology Office Note:    Date:  09/01/2020   ID:  Jason Joseph, DOB May 06, 1965, MRN [id number]  PCP:  Pearson Grippe, MD  Cardiologist:  Verne Carrow, MD  Electrophysiologist:  None   Referring MD: Pearson Grippe, MD   Chief Complaint: hospital follow-up of STEMI  History of Present Illness:    Jason Joseph is a 55 y.o. male with a history of CAD with recent STEMI on 08/14/2020 s/p DES to proximal RCA and DES distal RCA, hyperlipidemia, and type 2 diabetes mellitus who is followed by Dr. Clifton James and presents today for hospital follow-up after recent STEMI.  Patient was recently admitted from 08/14/2020 to 08/16/2020 for inferior STEMI after presenting with on and off chest pain for the past several weeks. High-sensitivity troponin peaked at 3,788. Emergent cardiac catheterization showed 90% stenosis of proximal RCA and 99% stenosis of distal RCA with moderate non-obstructive disease in LAD and LCX. Patient underwent DES to both the proximal and distal RCA lesions. Echo showed LVEF of 55-60% with akinesis of the inferior wall. Patient was started on DAPT with Aspirin and Brilinta as well as beta-blocker and high-intensity statin. Home Metformin was increased to 500mg  twice daily given hemoglobin A1c of 7.3%.   Patient presents today for follow-up. Here with wife. Doing well since discharge. He is exercising and has changed his diet. He is able to walk 10 miles on a tack near his house which is about 2 miles with no problems. No chest pain or shortness of breath with this. He notes occasional vague sensation on the left side of his chest at rest. No pain but he just states it feels "different." This occurs if he lays on his left side at night but he states he is also having that sensation here in the office today. However, again he denies any chest pain or arm pain which he had prior to STEMI. He has not had to take any dose of sublingual Nitro. He does note occasional transient shortness of breath at  rest which sounds like it is from [place]. No orthopnea. He denies any palpitations, lightheadedness, dizziness, or syncope. Compliant with all his medications including DAPT. No abnormal bleeding on this.  Patient's wife only concern is that he has he has been "borderline manic." She reports that he is always on the go. He has never liked to sit still but still is much more than normal. She states he has had a tendency to be a "hot head" in the past and this has also been a little worse. Patient admits that he was initially a little anxious and depressed after his MI but is now starting to feel better.   Past Medical History:  Diagnosis Date  . Diabetes mellitus without complication [facility])     Past Surgical History:  Procedure Laterality Date  . CORONARY/GRAFT ACUTE MI REVASCULARIZATION N/A 08/14/2020   Procedure: Coronary/Graft Acute MI Revascularization;  Surgeon: 08/16/2020, MD;  Location: [facility] INV[facility] CV [facility];  Service: Cardiovascular;  Laterality: N/A;  . LEFT HEART CATH AND CORONARY ANGIOGRAPHY N/A 08/14/2020   Procedure: LEFT HEART CATH AND CORONARY ANGIOGRAPHY;  Surgeon: 08/16/2020, MD;  Location: [facility] INVASIVE CV LAB;  Service: Cardiovascular;  Laterality: N/A;    Current Medications: Current Meds  Medication Sig  . aspirin 81 MG chewable tablet Chew 1 tablet (81 mg total) by mouth daily.  Kathleene Hazel atorvastatin (LIPITOR) 80 MG tablet Take 1 tablet (80 mg total) by mouth daily.  . metFORMIN (GLUCOPHAGE) 500  MG tablet Take 1 tablet (500 mg total) by mouth 2 (two) times daily with a meal.  . metoprolol tartrate (LOPRESSOR) 25 MG tablet Take 1 tablet (25 mg total) by mouth 2 (two) times daily.  . nitroGLYCERIN (NITROSTAT) 0.4 MG SL tablet Place 1 tablet (0.4 mg total) under the tongue every 5 (five) minutes as needed for chest pain.  . ticagrelor (BRILINTA) 90 MG TABS tablet Take 1 tablet (90 mg total) by mouth 2 (two) times daily.     Allergies:   Patient has no known  allergies.   Social History   Socioeconomic History  . Marital status: Married    Spouse name: Not on file  . Number of children: Not on file  . Years of education: Not on file  . Highest education level: Not on file  Occupational History  . Not on file  Tobacco Use  . Smoking status: Former Smoker    Start date: 04/15/2011  . Smokeless tobacco: Never Used  Substance and Sexual Activity  . Alcohol use: Never  . Drug use: Yes    Types: Marijuana  . Sexual activity: Not on file  Other Topics Concern  . Not on file  Social History Narrative  . Not on file   Social Determinants of Health   Financial Resource Strain: Not on file  Food Insecurity: Not on file  Transportation Needs: Not on file  Physical Activity: Not on file  Stress: Not on file  Social Connections: Not on file     Family History: The patient's family history includes Heart attack in his father.  ROS:   Please see the history of present illness.     EKGs/Labs/Other Studies Reviewed:    The following studies were reviewed today:  Cardiac Catheterization 08/14/2020:  Prox RCA lesion is 90% stenosed.  Dist RCA lesion is 99% stenosed.  Mid LAD lesion is 60% stenosed.  Mid Cx to Dist Cx lesion is 50% stenosed.  A drug-eluting stent was successfully placed using a SYNERGY XD 3.50X20.  Post intervention, there is a 0% residual stenosis.  A drug-eluting stent was successfully placed using a SYNERGY XD 3.0X32.  Post intervention, there is a 0% residual stenosis.   1. Inferior STEMI secondary to thrombotic stenosis of the distal RCA. Severe stenosis mid RCA as well. Successful PTCA/DES x 1 distal RCA and successful PTCA/DES x 1 mid RCA 2. Moderate stenosis in the mid LAD and mid Circumflex. These lesions do not appear to be flow limiting.   Recommendations: Will admit to the ICU. Continue DAPT with ASA and Brilinta for one year. Will start a high intensity statin and a beta blocker. SSI for coverage.  Echo to assess LVEF. Aggrastat drip for 2 hours post PCI.   Diagnostic Dominance: Right    Intervention     _______________  Echocardiogram 516/2022: Impressions: 1. Akinesis of the inferior wall (base, mid). Left ventricular ejection  fraction, by estimation, is 55 to 60%. The left ventricle has normal  function. The left ventricle has no regional wall motion abnormalities.  Left ventricular diastolic parameters  were normal.  2. Right ventricular systolic function is normal. The right ventricular  size is normal.  3. The mitral valve is normal in structure. Trivial mitral valve  regurgitation.  4. Aortic valve regurgitation is not visualized. Mild aortic valve  sclerosis is present, with no evidence of aortic valve stenosis.    EKG:  EKG  ordered today. EKG personally reviewed and demonstrates normal sinus rhythm,  rate 69 bpm, with Q waves and T wave inversions in inferior leads (evolving changes after recent STEMI). Left axis deviation. Normal PR and QRS intervals. QTc 417 ms.  Recent Labs: 08/15/2020: ALT 47; BUN 9; Creatinine, Ser 0.83; Hemoglobin 14.5; Platelets 274; Potassium 3.6; Sodium 136  Recent Lipid Panel    Component Value Date/Time   CHOL 222 (H) 08/14/2020 1203   TRIG 572 (H) 08/14/2020 1203   HDL 30 (L) 08/14/2020 1203   CHOLHDL 7.4 08/14/2020 1203   VLDL UNABLE TO CALCULATE IF TRIGLYCERIDE OVER 400 mg/dL 91/47/829505/15/2022 62131203   LDLCALC UNABLE TO CALCULATE IF TRIGLYCERIDE OVER 400 mg/dL 08/65/784605/15/2022 96291203   LDLDIRECT 95.0 08/14/2020 1203    Physical Exam:    Vital Signs: BP 106/70 (BP Location: Left Arm, Patient Position: Sitting, Cuff Size: Normal)   Pulse 69   Ht 5' 4.5" (1.638 m)   Wt 162 lb (73.5 kg)   BMI 27.38 kg/m     Wt Readings from Last 3 Encounters:  09/01/20 162 lb (73.5 kg)  08/14/20 165 lb (74.8 kg)  12/14/11 200 lb (90.7 kg)     General: 55 y.o. Caucasian male in no acute distress. HEENT: Normocephalic and atraumatic. Sclera clear.   Neck: Supple. No carotid bruits. No JVD. Heart: RRR. Distinct S1 and S2. No murmurs, gallops, or rubs. Radial pulses 2+ and equal bilaterally. Right radial cath site soft with no signs of hematoma. Lungs: No increased work of breathing. Clear to ausculation bilaterally. No wheezes, rhonchi, or rales.  Abdomen: Soft, non-distended, and non-tender to palpation.  Extremities: No lower extremity edema.    Skin: Warm and dry. Neuro: Alert and oriented x3. No focal deficits. Psych: Normal affect. Responds appropriately.  Assessment:    1. Coronary artery disease involving native coronary artery of native heart without angina pectoris   2. History of ST elevation myocardial infarction (STEMI)   3. Hyperlipidemia, unspecified hyperlipidemia type   4. Type 2 diabetes mellitus with complication, without long-term current use of insulin (HCC)   5. Behavior concern   6. Medication management     Plan:    CAD with Recent STEMI - Patient recently admitted with inferior STEMI s/p DES to proximal RCA and DES to distal RCA.  - No recurrent angina. - Continue DAPT with Aspirin and Brilinta. Will provide Patient Assistant forms for Brilinta today. - Continue Lopressor 25mg  twice daily and Lipitor 80mg  daily.  - Continue to increase activity as tolerated. Recommended Cardiac Rehab but patient does not want to get vaccinated for COVID-19 which is a requirement. 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 Will provide work note.  Of note, he does not some transient shortness of breath at rest that sounds consistent with Brilinta. Recommended taking with caffeine.  Hyperlipidemia - Lipid panel during recent admission: Total Choleserol 222, Triglycerides 572, HDL 30. Direct LDL 95. - LDL goal <70 given CAD. - Continue Lipitor 80mg  daily (started during admission). - Will need repeat lipid panel and LFTs in 6-8 weeks. May need additional agent if  triglycerides remain elevated but cost of Vascepa may be an issue.  Type 2 Diabetes Mellitus - Hemoglobin A1c 7.3 during recent admission.  - Continue Metformin 500mg  twice daily.  - Patient unable to afford SGLT2 inhibitor.  - Management per PCP.   Behavioral Concern - Wife states that patient has been "bordline manic" since discharge and more "hot headed." Patient states he  was a little anxious and depressed when he first got home after his MI but is not starting to feel better. - Recommended discussing with PCP if these concerns continue.   Disposition: Follow up in 3-4 months with Dr. Clifton James or APP.   Medication Adjustments/Labs and Tests Ordered: Current medicines are reviewed at length with the patient today.  Concerns regarding medicines are outlined above.  Orders Placed This Encounter  Procedures  . Lipid panel  . Hepatic function panel  . EKG 12-Lead   No orders of the defined types were placed in this encounter.   Patient Instructions  Medication Instructions:  Continue current medications  *If you need a refill on your cardiac medications before your next appointment, please call your pharmacy*   Lab Work: Fasting Lipid and Liver in 2 Months  If you have labs (blood work) drawn today and your tests are completely normal, you will receive your results only by: Marland Kitchen MyChart Message (if you have MyChart) OR . A paper copy in the mail If you have any lab test that is abnormal or we need to change your treatment, we will call you to review the results.   Testing/Procedures: None Ordered   Follow-Up: At [facility], you and your health needs are our priority.  As part of our continuing mission to provide you with exceptional heart care, we have created designated Provider Care Teams.  These Care Teams include your primary Cardiologist (physician) and Advanced Practice Providers (APPs -  Physician Assistants and Nurse Practitioners) who all work together to  provide you with the care you need, when you need it.  We recommend signing up for the patient portal called "MyChart".  Sign up information is provided on this After Visit Summary.  MyChart is used to connect with patients for Virtual Visits (Telemedicine).  Patients are able to view lab/test results, encounter notes, upcoming appointments, etc.  Non-urgent messages can be sent to your provider as well.   To learn more about what you can do with MyChart, go to [email or web address].    Your next appointment:   3-4 month(s)  The format for your next appointment:   In Person  Provider:   You may see Dr Stana Bunting or one of the following Advanced Practice Providers on your designated Care Team:    Ronie Spies, PA-C  Jacolyn Reedy, PA-C         Signed, Smitty Knudsen  09/01/2020 11:43 AM    [facility]

## 2020-09-01 ENCOUNTER — Encounter: Payer: Self-pay | Admitting: Student

## 2020-09-01 ENCOUNTER — Other Ambulatory Visit: Payer: Self-pay

## 2020-09-01 ENCOUNTER — Ambulatory Visit (INDEPENDENT_AMBULATORY_CARE_PROVIDER_SITE_OTHER): Payer: 59 | Admitting: Student

## 2020-09-01 VITALS — BP 106/70 | HR 69 | Ht 64.5 in | Wt 162.0 lb

## 2020-09-01 DIAGNOSIS — R4689 Other symptoms and signs involving appearance and behavior: Secondary | ICD-10-CM

## 2020-09-01 DIAGNOSIS — I252 Old myocardial infarction: Secondary | ICD-10-CM

## 2020-09-01 DIAGNOSIS — E118 Type 2 diabetes mellitus with unspecified complications: Secondary | ICD-10-CM | POA: Diagnosis not present

## 2020-09-01 DIAGNOSIS — E785 Hyperlipidemia, unspecified: Secondary | ICD-10-CM

## 2020-09-01 DIAGNOSIS — I2510 Atherosclerotic heart disease of native coronary artery without angina pectoris: Secondary | ICD-10-CM | POA: Diagnosis not present

## 2020-09-01 DIAGNOSIS — Z79899 Other long term (current) drug therapy: Secondary | ICD-10-CM

## 2020-09-01 NOTE — Patient Instructions (Signed)
Medication Instructions:  Continue current medications  *If you need a refill on your cardiac medications before your next appointment, please call your pharmacy*   Lab Work: Fasting Lipid and Liver in 2 Months  If you have labs (blood work) drawn today and your tests are completely normal, you will receive your results only by: Marland Kitchen MyChart Message (if you have MyChart) OR . A paper copy in the mail If you have any lab test that is abnormal or we need to change your treatment, we will call you to review the results.   Testing/Procedures: None Ordered   Follow-Up: At [facility], you and your health needs are our priority.  As part of our continuing mission to provide you with exceptional heart care, we have created designated Provider Care Teams.  These Care Teams include your primary Cardiologist (physician) and Advanced Practice Providers (APPs -  Physician Assistants and Nurse Practitioners) who all work together to provide you with the care you need, when you need it.  We recommend signing up for the patient portal called "MyChart".  Sign up information is provided on this After Visit Summary.  MyChart is used to connect with patients for Virtual Visits (Telemedicine).  Patients are able to view lab/test results, encounter notes, upcoming appointments, etc.  Non-urgent messages can be sent to your provider as well.   To learn more about what you can do with MyChart, go to [email or web address].    Your next appointment:   3-4 month(s)  The format for your next appointment:   In Person  Provider:   You may see Dr Stana Bunting or one of the following Advanced Practice Providers on your designated Care Team:    Ronie Spies, PA-C  Jacolyn Reedy, PA-C

## 2020-09-29 ENCOUNTER — Telehealth (HOSPITAL_COMMUNITY): Payer: Self-pay

## 2020-09-29 NOTE — Telephone Encounter (Signed)
Pt insurance is active and benefits verified through [employer] Co-pay $150, DED $8,700/$564.33 met, out of pocket $8,700/$586.28 met, co-insurance 0%. no pre-authorization required. Connie/[employer] 09/05/2020@9:08am, REF# [id number]   Will contact patient to see if he is interested in the Cardiac Rehab Program. If interested, patient will need to complete follow up appt. Once completed, patient will be contacted for scheduling upon review by the RN Navigator. 

## 2020-09-29 NOTE — Telephone Encounter (Signed)
Attempted to call patient in regards to Cardiac Rehab - LM on VM 

## 2020-10-19 ENCOUNTER — Encounter (HOSPITAL_COMMUNITY): Payer: Self-pay

## 2020-10-19 NOTE — Telephone Encounter (Signed)
Attempted to call patient in regards to Cardiac Rehab - LM on VM Mailed letter 

## 2020-11-07 ENCOUNTER — Ambulatory Visit (INDEPENDENT_AMBULATORY_CARE_PROVIDER_SITE_OTHER): Payer: 59 | Admitting: Internal Medicine

## 2020-11-07 ENCOUNTER — Encounter: Payer: Self-pay | Admitting: Internal Medicine

## 2020-11-07 ENCOUNTER — Other Ambulatory Visit: Payer: Self-pay

## 2020-11-07 VITALS — BP 112/68 | HR 68 | Temp 98.6°F | Resp 16 | Ht 64.5 in | Wt 170.0 lb

## 2020-11-07 DIAGNOSIS — R7989 Other specified abnormal findings of blood chemistry: Secondary | ICD-10-CM | POA: Diagnosis not present

## 2020-11-07 DIAGNOSIS — Z0001 Encounter for general adult medical examination with abnormal findings: Secondary | ICD-10-CM

## 2020-11-07 DIAGNOSIS — E785 Hyperlipidemia, unspecified: Secondary | ICD-10-CM | POA: Insufficient documentation

## 2020-11-07 DIAGNOSIS — Z0000 Encounter for general adult medical examination without abnormal findings: Secondary | ICD-10-CM | POA: Diagnosis not present

## 2020-11-07 DIAGNOSIS — E781 Pure hyperglyceridemia: Secondary | ICD-10-CM | POA: Diagnosis not present

## 2020-11-07 DIAGNOSIS — Z23 Encounter for immunization: Secondary | ICD-10-CM | POA: Diagnosis not present

## 2020-11-07 DIAGNOSIS — E118 Type 2 diabetes mellitus with unspecified complications: Secondary | ICD-10-CM

## 2020-11-07 DIAGNOSIS — E119 Type 2 diabetes mellitus without complications: Secondary | ICD-10-CM

## 2020-11-07 DIAGNOSIS — R768 Other specified abnormal immunological findings in serum: Secondary | ICD-10-CM

## 2020-11-07 LAB — LIPID PANEL
Cholesterol: 112 mg/dL (ref 0–200)
HDL: 27 mg/dL — ABNORMAL LOW (ref >39.00)
NonHDL: 84.86
Total CHOL/HDL Ratio: 4
Triglycerides: 250 mg/dL — ABNORMAL HIGH (ref 0.0–149.0)
VLDL: 50 mg/dL — ABNORMAL HIGH (ref 0.0–40.0)

## 2020-11-07 LAB — PROTIME-INR
INR: 1 ratio (ref 0.8–1.0)
Prothrombin Time: 10.7 s (ref 9.6–13.1)

## 2020-11-07 LAB — MICROALBUMIN / CREATININE URINE RATIO
Creatinine,U: 197.7 mg/dL
Microalb Creat Ratio: 0.4 mg/g (ref 0.0–30.0)
Microalb, Ur: <0.7 mg/dL (ref 0.0–1.9)

## 2020-11-07 LAB — BASIC METABOLIC PANEL
BUN: 13 mg/dL (ref 6–23)
CO2: 25 mEq/L (ref 19–32)
Calcium: 9.6 mg/dL (ref 8.4–10.5)
Chloride: 103 mEq/L (ref 96–112)
Creatinine, Ser: 0.93 mg/dL (ref 0.40–1.50)
GFR: 91.09 mL/min (ref >60.00)
Glucose, Bld: 113 mg/dL — ABNORMAL HIGH (ref 70–99)
Potassium: 4.3 mEq/L (ref 3.5–5.1)
Sodium: 138 mEq/L (ref 135–145)

## 2020-11-07 LAB — LDL CHOLESTEROL, DIRECT: Direct LDL: 53 mg/dL

## 2020-11-07 LAB — PSA: PSA: 0.31 ng/mL (ref 0.10–4.00)

## 2020-11-07 LAB — TSH: TSH: 2.57 u[IU]/mL (ref 0.35–5.50)

## 2020-11-07 NOTE — Progress Notes (Signed)
New Patient Office Visit  Subjective:  Patient ID: Jason Joseph, male    DOB: 1965/09/12  Age: 55 y.o. MRN: [id number]  CC:  Chief Complaint  Patient presents with   Annual Exam   Hyperlipidemia   Coronary Artery Disease   Diabetes   This visit occurred during the SARS-CoV-2 public health emergency.  Safety protocols were in place, including screening questions prior to the visit, additional usage of staff PPE, and extensive cleaning of exam room while observing appropriate contact time as indicated for disinfecting solutions.    HPI Jason Joseph presents for a CPX and f/up -   He had an MI 3 months ago.  He is active and denies CP, DOE, palpitations, edema, or fatigue.  He thinks his blood sugars are well controlled.  He denies polys.  Past Medical History:  Diagnosis Date   CAD (coronary artery disease)    Hyperlipidemia    Type 2 diabetes mellitus with complication, without long-term current use of insulin (HCC)     Past Surgical History:  Procedure Laterality Date   CORONARY/GRAFT ACUTE MI REVASCULARIZATION N/A 08/14/2020   Procedure: Coronary/Graft Acute MI Revascularization;  Surgeon: Kathleene Hazel, MD;  Location: [facility] INVASIVE CV LAB;  Service: Cardiovascular;  Laterality: N/A;   LEFT HEART CATH AND CORONARY ANGIOGRAPHY N/A 08/14/2020   Procedure: LEFT HEART CATH AND CORONARY ANGIOGRAPHY;  Surgeon: Kathleene Hazel, MD;  Location: [facility]VAS[facility] CV [facility];  Service: Cardiovascular;  Laterality: N/A;    Family History  Problem Relation Age of Onset   Heart attack Father    Prostate cancer Father     Social History   Socioeconomic History   Marital status: Married    Spouse name: Not on file   Number of children: Not on file   Years of education: Not on file   Highest education level: Not on file  Occupational History   Not on file  Tobacco Use   Smoking status: Former    Types: Cigarettes    Start date: 04/15/2011   Smokeless tobacco: Never   Substance and Sexual Activity   Alcohol use: Never   Drug use: Yes    Types: Marijuana   Sexual activity: Yes    Partners: Female  Other Topics Concern   Not on file  Social History Narrative   Not on file   Social Determinants of Health   Financial Resource Strain: Not on file  Food Insecurity: Not on file  Transportation Needs: Not on file  Physical Activity: Not on file  Stress: Not on file  Social Connections: Not on file  Intimate Partner Violence: Not on file    ROS Review of Systems  Constitutional:  Negative for appetite change, diaphoresis, fatigue and unexpected weight change.  HENT: Negative.    Eyes: Negative.   Respiratory:  Negative for chest tightness, shortness of breath and wheezing.   Cardiovascular:  Negative for chest pain, palpitations and leg swelling.  Gastrointestinal:  Negative for abdominal pain, constipation, diarrhea, nausea and vomiting.  Endocrine: Negative.   Genitourinary: Negative.  Negative for difficulty urinating, scrotal swelling and testicular pain.  Musculoskeletal:  Negative for arthralgias, back pain, myalgias and neck pain.  Skin: Negative.  Negative for color change and pallor.  Neurological: Negative.  Negative for dizziness, weakness, light-headedness, numbness and headaches.  Hematological:  Negative for adenopathy. Does not bruise/bleed easily.  Psychiatric/Behavioral: Negative.     Objective:   Today's Vitals: BP 112/68 (BP Location: Left Arm, Patient Position:  Sitting, Cuff Size: Large)   Pulse 68   Temp 98.6 F (37 C) (Oral)   Resp 16   Ht 5' 4.5" (1.638 m)   Wt 170 lb (77.1 kg)   SpO2 96%   BMI 28.73 kg/m   Physical Exam Vitals reviewed.  Constitutional:      Appearance: Normal appearance.  HENT:     Nose: Nose normal.     Mouth/Throat:     Mouth: Mucous membranes are moist.  Eyes:     Conjunctiva/sclera: Conjunctivae normal.  Cardiovascular:     Rate and Rhythm: Normal rate and regular rhythm.      Heart sounds: No murmur heard. Pulmonary:     Effort: Pulmonary effort is normal.     Breath sounds: No stridor. No wheezing, rhonchi or rales.  Abdominal:     General: Abdomen is flat. Bowel sounds are normal. There is no distension.     Palpations: Abdomen is soft. There is no hepatomegaly, splenomegaly or mass.     Tenderness: There is no abdominal tenderness. There is no guarding.     Hernia: No hernia is present. There is no hernia in the left inguinal area or right inguinal area.  Genitourinary:    Pubic Area: No rash.      Penis: Normal and circumcised. No lesions.      Testes: Normal.     Epididymis:     Right: Normal.     Left: Normal.     Prostate: Normal. Not enlarged, not tender and no nodules present.     Rectum: Normal. Guaiac result negative. No mass, tenderness, anal fissure, external hemorrhoid or internal hemorrhoid. Normal anal tone.  Musculoskeletal:        General: Normal range of motion.     Cervical back: Neck supple.     Right lower leg: No edema.     Left lower leg: No edema.  Lymphadenopathy:     Cervical: No cervical adenopathy.     Lower Body: No right inguinal adenopathy. No left inguinal adenopathy.  Skin:    General: Skin is warm and dry.  Neurological:     General: No focal deficit present.     Mental Status: He is alert.  Psychiatric:        Mood and Affect: Mood normal.        Behavior: Behavior normal.     Lab Results  Component Value Date   WBC 10.2 08/15/2020   HGB 14.5 08/15/2020   HCT 41.1 08/15/2020   PLT 274 08/15/2020   GLUCOSE 113 (H) 11/07/2020   CHOL 112 11/07/2020   TRIG 250.0 (H) 11/07/2020   HDL 27.00 (L) 11/07/2020   LDLDIRECT 53.0 11/07/2020   LDLCALC UNABLE TO CALCULATE IF TRIGLYCERIDE OVER 400 mg/dL 99/37/1696   ALT 47 (H) 08/15/2020   AST 49 (H) 08/15/2020   NA 138 11/07/2020   K 4.3 11/07/2020   CL 103 11/07/2020   CREATININE 0.93 11/07/2020   BUN 13 11/07/2020   CO2 25 11/07/2020   TSH 2.57 11/07/2020    PSA 0.31 11/07/2020   INR 1.0 11/07/2020   HGBA1C 7.3 (H) 08/14/2020   MICROALBUR <0.7 11/07/2020     Assessment & Plan:   Problem List Items Addressed This Visit     Encounter for general adult medical examination with abnormal findings - Primary   Relevant Orders   Lipid panel (Completed)   PSA (Completed)   HIV Antibody (routine testing w rflx) (Completed)   Type  II diabetes mellitus with complication (HCC)   Relevant Orders   Basic metabolic panel (Completed)   Microalbumin / creatinine urine ratio (Completed)   Ambulatory referral to Ophthalmology   HM Diabetes Foot Exam (Completed)   Hemoglobin A1c   Elevated LFTs   Relevant Orders   Hepatitis A antibody, total (Completed)   Hepatitis B core antibody, total (Completed)   Hepatitis B surface antibody,qualitative (Completed)   Hepatitis C antibody (Completed)   Protime-INR (Completed)   Hepatic function panel   Pure hypertriglyceridemia   Hyperlipidemia LDL goal <70   Relevant Orders   TSH (Completed)    Outpatient Encounter Medications as of 11/07/2020  Medication Sig   aspirin 81 MG chewable tablet Chew 1 tablet (81 mg total) by mouth daily.   atorvastatin (LIPITOR) 80 MG tablet Take 1 tablet (80 mg total) by mouth daily.   metFORMIN (GLUCOPHAGE) 500 MG tablet Take 1 tablet (500 mg total) by mouth 2 (two) times daily with a meal.   metoprolol tartrate (LOPRESSOR) 25 MG tablet Take 1 tablet (25 mg total) by mouth 2 (two) times daily.   nitroGLYCERIN (NITROSTAT) 0.4 MG SL tablet Place 1 tablet (0.4 mg total) under the tongue every 5 (five) minutes as needed for chest pain.   ticagrelor (BRILINTA) 90 MG TABS tablet Take 1 tablet (90 mg total) by mouth 2 (two) times daily.   No facility-administered encounter medications on file as of 11/07/2020.    Follow-up: Return in about 3 months (around 02/07/2021).   Sanda Linger, MD

## 2020-11-07 NOTE — Patient Instructions (Signed)
Health Maintenance, Male Adopting a healthy lifestyle and getting preventive care are important in promoting health and wellness. Ask your health care provider about: The right schedule for you to have regular tests and exams. Things you can do on your own to prevent diseases and keep yourself healthy. What should I know about diet, weight, and exercise? Eat a healthy diet  Eat a diet that includes plenty of vegetables, fruits, low-fat dairy products, and lean protein. Do not eat a lot of foods that are high in solid fats, added sugars, or sodium.  Maintain a healthy weight Body mass index (BMI) is a measurement that can be used to identify possible weight problems. It estimates body fat based on height and weight. Your health care provider can help determine your BMI and help you achieve or maintain ahealthy weight. Get regular exercise Get regular exercise. This is one of the most important things you can do for your health. Most adults should: Exercise for at least 150 minutes each week. The exercise should increase your heart rate and make you sweat (moderate-intensity exercise). Do strengthening exercises at least twice a week. This is in addition to the moderate-intensity exercise. Spend less time sitting. Even light physical activity can be beneficial. Watch cholesterol and blood lipids Have your blood tested for lipids and cholesterol at 57 years of age, then havethis test every 5 years. You may need to have your cholesterol levels checked more often if: Your lipid or cholesterol levels are high. You are older than 57 years of age. You are at high risk for heart disease. What should I know about cancer screening? Many types of cancers can be detected early and may often be prevented. Depending on your health history and family history, you may need to have cancer screening at various ages. This may include screening for: Colorectal cancer. Prostate cancer. Skin cancer. Lung  cancer. What should I know about heart disease, diabetes, and high blood pressure? Blood pressure and heart disease High blood pressure causes heart disease and increases the risk of stroke. This is more likely to develop in people who have high blood pressure readings, are of African descent, or are overweight. Talk with your health care provider about your target blood pressure readings. Have your blood pressure checked: Every 3-5 years if you are 18-39 years of age. Every year if you are 40 years old or older. If you are between the ages of 65 and 75 and are a current or former smoker, ask your health care provider if you should have a one-time screening for abdominal aortic aneurysm (AAA). Diabetes Have regular diabetes screenings. This checks your fasting blood sugar level. Have the screening done: Once every three years after age 45 if you are at a normal weight and have a low risk for diabetes. More often and at a younger age if you are overweight or have a high risk for diabetes. What should I know about preventing infection? Hepatitis B If you have a higher risk for hepatitis B, you should be screened for this virus. Talk with your health care provider to find out if you are at risk forhepatitis B infection. Hepatitis C Blood testing is recommended for: Everyone born from 1945 through 1965. Anyone with known risk factors for hepatitis C. Sexually transmitted infections (STIs) You should be screened each year for STIs, including gonorrhea and chlamydia, if: You are sexually active and are younger than 57 years of age. You are older than 57 years of age   and your health care provider tells you that you are at risk for this type of infection. Your sexual activity has changed since you were last screened, and you are at increased risk for chlamydia or gonorrhea. Ask your health care provider if you are at risk. Ask your health care provider about whether you are at high risk for HIV.  Your health care provider may recommend a prescription medicine to help prevent HIV infection. If you choose to take medicine to prevent HIV, you should first get tested for HIV. You should then be tested every 3 months for as long as you are taking the medicine. Follow these instructions at home: Lifestyle Do not use any products that contain nicotine or tobacco, such as cigarettes, e-cigarettes, and chewing tobacco. If you need help quitting, ask your health care provider. Do not use street drugs. Do not share needles. Ask your health care provider for help if you need support or information about quitting drugs. Alcohol use Do not drink alcohol if your health care provider tells you not to drink. If you drink alcohol: Limit how much you have to 0-2 drinks a day. Be aware of how much alcohol is in your drink. In the [place] one drink equals one 12 oz bottle of beer (355 mL), one 5 oz glass of wine (148 mL), or one 1 oz glass of hard liquor (44 mL). General instructions Schedule regular health, dental, and eye exams. Stay current with your vaccines. Tell your health care provider if: You often feel depressed. You have ever been abused or do not feel safe at home. Summary Adopting a healthy lifestyle and getting preventive care are important in promoting health and wellness. Follow your health care provider's instructions about healthy diet, exercising, and getting tested or screened for diseases. Follow your health care provider's instructions on monitoring your cholesterol and blood pressure. This information is not intended to replace advice given to you by your health care provider. Make sure you discuss any questions you have with your healthcare provider. Document Revised: 03/12/2018 Document Reviewed: 03/12/2018 Elsevier Patient Education  2022 Elsevier Inc.  

## 2020-11-09 ENCOUNTER — Encounter: Payer: Self-pay | Admitting: Internal Medicine

## 2020-11-09 ENCOUNTER — Telehealth (HOSPITAL_COMMUNITY): Payer: Self-pay

## 2020-11-09 NOTE — Telephone Encounter (Signed)
No repsonse from pt.  Closed referral  

## 2020-11-10 LAB — HIV ANTIBODY (ROUTINE TESTING W REFLEX): HIV 1&2 Ab, 4th Generation: NONREACTIVE

## 2020-11-10 LAB — HEPATITIS B SURFACE ANTIBODY,QUALITATIVE: Hep B S Ab: NONREACTIVE

## 2020-11-10 LAB — HCV RNA,QUANTITATIVE REAL TIME PCR
HCV Quantitative Log: <1.18 Log IU/mL
HCV RNA, PCR, QN: <15 IU/mL

## 2020-11-10 LAB — HEPATITIS C ANTIBODY
Hepatitis C Ab: REACTIVE — AB
SIGNAL TO CUT-OFF: 26 — ABNORMAL HIGH (ref <1.00)

## 2020-11-10 LAB — HEPATITIS B CORE ANTIBODY, TOTAL: Hep B Core Total Ab: NONREACTIVE

## 2020-11-10 LAB — HEPATITIS A ANTIBODY, TOTAL: Hepatitis A AB,Total: NONREACTIVE

## 2020-11-13 ENCOUNTER — Encounter: Payer: Self-pay | Admitting: Internal Medicine

## 2020-11-13 NOTE — Assessment & Plan Note (Signed)
Improvement noted 

## 2020-11-29 NOTE — Progress Notes (Deleted)
Cardiology Office Note    Date:  11/29/2020   ID:  Jason Joseph, DOB Nov 07, 1965, MRN [id number]   PCP:  Jason Grandchild, MD   [facility]  Cardiologist:  Jason Carrow, MD   Advanced Practice Provider:  No care team member to display Electrophysiologist:  None   [phone]   No chief complaint on file.   History of Present Illness:  Jason Joseph is a 56 y.o. male  with a history of CAD with  STEMI on 08/14/2020 s/p DES to proximal RCA and DES distal RCA, nonobstructive disease in the LAD and circumflex, echo with normal LVEF 55 to 60% akinesis of the inferior wall hyperlipidemia, and type 2 diabetes mellitus   Patient saw Ms. Jason Limbo, PA-C 09/01/2020 at which time he had some anxiety depression after his MI and asked to f/u with PCP.  Past Medical History:  Diagnosis Date   CAD (coronary artery disease)    Hyperlipidemia    Type 2 diabetes mellitus with complication, without long-term current use of insulin (HCC)     Past Surgical History:  Procedure Laterality Date   CORONARY/GRAFT ACUTE MI REVASCULARIZATION N/A 08/14/2020   Procedure: Coronary/Graft Acute MI Revascularization;  Surgeon: Jason Hazel, MD;  Location: [facility];  Service: Cardiovascular;  Laterality: N/A;   LEFT HEART CATH AND CORONARY ANGIOGRAPHY N/A 08/14/2020   Procedure: LEFT HEART CATH AND CORONARY ANGIOGRAPHY;  Surgeon: Jason Hazel, MD;  Location: [facility];  Service: Cardiovascular;  Laterality: N/A;    Current Medications: No outpatient medications have been marked as taking for the 12/07/20 encounter (Appointment) with Jason Kief, PA-C.     Allergies:   Patient has no known allergies.   Social History   Socioeconomic History   Marital status: Married    Spouse name: Not on file   Number of children: Not on file   Years of education: Not on file   Highest education level: Not on file  Occupational History   Not on file   Tobacco Use   Smoking status: Former    Types: Cigarettes    Start date: 04/15/2011   Smokeless tobacco: Never  Substance and Sexual Activity   Alcohol use: Never   Drug use: Yes    Types: Marijuana   Sexual activity: Yes    Partners: Female  Other Topics Concern   Not on file  Social History Narrative   Not on file   Social Determinants of Health   Financial Resource Strain: Not on file  Food Insecurity: Not on file  Transportation Needs: Not on file  Physical Activity: Not on file  Stress: Not on file  Social Connections: Not on file     Family History:  The patient's ***family history includes Heart attack in his father; Prostate cancer in his father.   ROS:   Please see the history of present illness.    ROS All other systems reviewed and are negative.   PHYSICAL EXAM:   VS:  There were no vitals taken for this visit.  Physical Exam  GEN: Well nourished, well developed, in no acute distress  HEENT: normal  Neck: no JVD, carotid bruits, or masses Cardiac:RRR; no murmurs, rubs, or gallops  Respiratory:  clear to auscultation bilaterally, normal work of breathing GI: soft, nontender, nondistended, + BS Ext: without cyanosis, clubbing, or edema, Good distal pulses bilaterally MS: no deformity or atrophy  Skin: warm and dry, no rash Neuro:  Alert and Oriented x 3, Strength and sensation are intact Psych: euthymic mood, full affect  Wt Readings from Last 3 Encounters:  11/07/20 170 lb (77.1 kg)  09/01/20 162 lb (73.5 kg)  08/14/20 165 lb (74.8 kg)      Studies/Labs Reviewed:   EKG:  EKG is*** ordered today.  The ekg ordered today demonstrates ***  Recent Labs: 08/15/2020: ALT 47; Hemoglobin 14.5; Platelets 274 11/07/2020: BUN 13; Creatinine, Ser 0.93; Potassium 4.3; Sodium 138; TSH 2.57   Lipid Panel    Component Value Date/Time   CHOL 112 11/07/2020 1351   TRIG 250.0 (H) 11/07/2020 1351   HDL 27.00 (L) 11/07/2020 1351   CHOLHDL 4 11/07/2020 1351    VLDL 50.0 (H) 11/07/2020 1351   LDLCALC UNABLE TO CALCULATE IF TRIGLYCERIDE OVER 400 mg/dL 37/01/6268 4854   LDLDIRECT 53.0 11/07/2020 1351    Additional studies/ records that were reviewed today include:   Echocardiogram 516/2022: Impressions: 1. Akinesis of the inferior wall (base, mid). Left ventricular ejection  fraction, by estimation, is 55 to 60%. The left ventricle has normal  function. The left ventricle has no regional wall motion abnormalities.  Left ventricular diastolic parameters  were normal.   2. Right ventricular systolic function is normal. The right ventricular  size is normal.   3. The mitral valve is normal in structure. Trivial mitral valve  regurgitation.   4. Aortic valve regurgitation is not visualized. Mild aortic valve  sclerosis is present, with no evidence of aortic valve stenosis.     Cardiac Catheterization 08/14/2020: Prox RCA lesion is 90% stenosed. Dist RCA lesion is 99% stenosed. Mid LAD lesion is 60% stenosed. Mid Cx to Dist Cx lesion is 50% stenosed. A drug-eluting stent was successfully placed using a SYNERGY XD 3.50X20. Post intervention, there is a 0% residual stenosis. A drug-eluting stent was successfully placed using a SYNERGY XD 3.0X32. Post intervention, there is a 0% residual stenosis.   1. Inferior STEMI secondary to thrombotic stenosis of the distal RCA. Severe stenosis mid RCA as well. Successful PTCA/DES x 1 distal RCA and successful PTCA/DES x 1 mid RCA 2. Moderate stenosis in the mid LAD and mid Circumflex. These lesions do not appear to be flow limiting.    Recommendations: Will admit to the ICU. Continue DAPT with ASA and Brilinta for one year. Will start a high intensity statin and a beta blocker. SSI for coverage. Echo to assess LVEF. Aggrastat drip for 2 hours post PCI.      Risk Assessment/Calculations:   {Does this patient have ATRIAL FIBRILLATION?:[phone]}     ASSESSMENT:    1. Coronary artery disease  involving native coronary artery of native heart without angina pectoris   2. Essential hypertension   3. Other hyperlipidemia   4. Type 2 diabetes mellitus with complication, without long-term current use of insulin (HCC)      PLAN:  In order of problems listed above:  CAD with  STEMI on 08/14/2020 s/p DES to proximal RCA and DES distal RCA, nonobstructive disease in the LAD and circumflex, echo with normal LVEF 55 to 60% akinesis of the inferior wall   Hypertension  HLD  DM2   Shared Decision Making/Informed Consent   {Are you ordering a CV Procedure (e.g. stress test, cath, DCCV, TEE, etc)?   Press F2        :[id number]}    Medication Adjustments/Labs and Tests Ordered: Current medicines are reviewed at length with the patient today.  Concerns regarding medicines are  outlined above.  Medication changes, Labs and Tests ordered today are listed in the Patient Instructions below. There are no Patient Instructions on file for this visit.   Signed, Jacolyn Reedy, PA-C  11/29/2020 3:10 PM    [facility] [address] [place], [place], [place]  [address] Phone: [phone]; Fax: [phone]

## 2020-12-07 ENCOUNTER — Ambulatory Visit: Payer: 59 | Admitting: Physician Assistant

## 2020-12-07 DIAGNOSIS — E7849 Other hyperlipidemia: Secondary | ICD-10-CM

## 2020-12-07 DIAGNOSIS — I10 Essential (primary) hypertension: Secondary | ICD-10-CM

## 2020-12-07 DIAGNOSIS — I2510 Atherosclerotic heart disease of native coronary artery without angina pectoris: Secondary | ICD-10-CM

## 2020-12-07 DIAGNOSIS — E118 Type 2 diabetes mellitus with unspecified complications: Secondary | ICD-10-CM

## 2021-03-14 ENCOUNTER — Other Ambulatory Visit: Payer: Self-pay | Admitting: Home Health

## 2021-04-18 NOTE — Progress Notes (Signed)
Primary Physician/Referring:  [name], DO  Patient ID: Jason Joseph, male    DOB: Nov 13, 1965, 56 y.o.   MRN: [id number]  Chief Complaint  Patient presents with   New Patient (Initial Visit)   Coronary Artery Disease   HPI:    Jason Joseph  is a 56 y.o. Caucasian male patient with uncontrolled diabetes mellitus, mixed hyperlipidemia, presenting with acute inferior STEMI on 08/14/2020 underwent primary PCI with 2 overlapping stent implantation to RCA.  Has moderate disease in the LCx and LAD with preserved LVEF.    He has not had a follow-up since May 2022.  Now presents to establish care. He has not seen a physician in > 6 months. He wants to establish care with Dr. Jeneen Rinks Kim's group as he followed with them in past.  He is asymptomatic and states that one of his relatives sees me and hence wants to establish with me.  Past Medical History:  Diagnosis Date   CAD (coronary artery disease)    Hyperlipidemia    Type 2 diabetes mellitus with complication, without long-term current use of insulin ([place])    Past Surgical History:  Procedure Laterality Date   CORONARY/GRAFT ACUTE MI REVASCULARIZATION N/A 08/14/2020   Procedure: Coronary/Graft Acute MI Revascularization;  Surgeon: Burnell Blanks, MD;  Location: [place] [facility];  Service: Cardiovascular;  Laterality: N/A;   LEFT HEART CATH AND CORONARY ANGIOGRAPHY N/A 08/14/2020   Procedure: LEFT HEART CATH AND CORONARY ANGIOGRAPHY;  Surgeon: Burnell Blanks, MD;  Location: [place] [facility];  Service: Cardiovascular;  Laterality: N/A;   Family History  Problem Relation Age of Onset   Heart attack Father 27   Heart disease Father    Prostate cancer Father    Hypertension Brother    Heart disease Brother     Social History   Tobacco Use   Smoking status: Former    Types: Cigarettes    Start date: 04/15/2011   Smokeless tobacco: Never  Substance Use Topics   Alcohol use: Never   Marital Status: Married  ROS   Review of Systems  Cardiovascular:  Negative for chest pain, dyspnea on exertion and leg swelling.  Gastrointestinal:  Negative for melena.  Objective  Blood pressure (!) 148/74, pulse 79, temperature 98.6 F (37 C), temperature source Temporal, resp. rate 17, height 5' 4.5" (1.638 m), weight 170 lb (77.1 kg), SpO2 99 %. Body mass index is 28.73 kg/m.  Vitals with BMI 04/19/2021 11/07/2020 09/01/2020  Height 5' 4.5" 5' 4.5" 5' 4.5"  Weight 170 lbs 170 lbs 162 lbs  BMI 28.74 78.29 56.21  Systolic 308 657 846  Diastolic 74 68 70  Pulse 79 68 69    Physical Exam Neck:     Vascular: No carotid bruit or JVD.  Cardiovascular:     Rate and Rhythm: Normal rate and regular rhythm.     Pulses: Intact distal pulses.     Heart sounds: Normal heart sounds. No murmur heard.   No gallop.  Pulmonary:     Effort: Pulmonary effort is normal.     Breath sounds: Normal breath sounds.  Abdominal:     General: Bowel sounds are normal.     Palpations: Abdomen is soft.  Musculoskeletal:        General: No swelling.     Laboratory examination:   Recent Labs    08/14/20 1203 08/14/20 1303 08/15/20 0028 11/07/20 1351  NA 137 138 136 138  K 3.8 4.0 3.6 4.3  CL 104 105 108 103  CO2 20*  --  20* 25  GLUCOSE 190* 213* 134* 113*  BUN 15 14 9 13   CREATININE 1.14 0.90 0.83 0.93  CALCIUM 9.7  --  8.3* 9.6  GFRNONAA >60  --  >60  --    CrCl cannot be calculated (Patient's most recent lab result is older than the maximum 21 days allowed.).  CMP Latest Ref Rng & Units 11/07/2020 08/15/2020 08/14/2020  Glucose 70 - 99 mg/dL 113(H) 134(H) 213(H)  BUN 6 - 23 mg/dL 13 9 14   Creatinine 0.40 - 1.50 mg/dL 0.93 0.83 0.90  Sodium 135 - 145 mEq/L 138 136 138  Potassium 3.5 - 5.1 mEq/L 4.3 3.6 4.0  Chloride 96 - 112 mEq/L 103 108 105  CO2 19 - 32 mEq/L 25 20(L) -  Calcium 8.4 - 10.5 mg/dL 9.6 8.3(L) -  Total Protein 6.5 - 8.1 g/dL - 6.1(L) -  Total Bilirubin 0.3 - 1.2 mg/dL - 0.7 -  Alkaline Phos 38 - 126 U/L  - 38 -  AST 15 - 41 U/L - 49(H) -  ALT 0 - 44 U/L - 47(H) -   CBC Latest Ref Rng & Units 08/15/2020 08/14/2020 08/14/2020  WBC 4.0 - 10.5 K/uL 10.2 - 14.6(H)  Hemoglobin 13.0 - 17.0 g/dL 14.5 13.3 16.2  Hematocrit 39.0 - 52.0 % 41.1 39.0 45.7  Platelets 150 - 400 K/uL 274 - 395    Lipid Panel Recent Labs    08/14/20 1203 11/07/20 1351  CHOL 222* 112  TRIG 572* 250.0*  LDLCALC UNABLE TO CALCULATE IF TRIGLYCERIDE OVER 400 mg/dL  --   VLDL UNABLE TO CALCULATE IF TRIGLYCERIDE OVER 400 mg/dL 50.0*  HDL 30* 27.00*  CHOLHDL 7.4 4  LDLDIRECT 95.0 53.0    HEMOGLOBIN A1C Lab Results  Component Value Date   HGBA1C 7.3 (H) 08/14/2020   MPG 162.81 08/14/2020   TSH Recent Labs    11/07/20 1351  TSH 2.57   Medications and allergies  No Known Allergies   Medication prior to this encounter:   Outpatient Medications Prior to Visit  Medication Sig Dispense Refill   aspirin 81 MG chewable tablet Chew 1 tablet (81 mg total) by mouth daily. 90 tablet 2   nitroGLYCERIN (NITROSTAT) 0.4 MG SL tablet Place 1 tablet (0.4 mg total) under the tongue every 5 (five) minutes as needed for chest pain. 25 tablet 2   Omega 3 1000 MG CAPS Take 1 capsule by mouth daily.     atorvastatin (LIPITOR) 80 MG tablet Take 1 tablet (80 mg total) by mouth daily. 90 tablet 2   metFORMIN (GLUCOPHAGE) 500 MG tablet Take 1 tablet (500 mg total) by mouth 2 (two) times daily with a meal. 180 tablet 1   metoprolol tartrate (LOPRESSOR) 25 MG tablet Take 1 tablet (25 mg total) by mouth 2 (two) times daily. 180 tablet 2   ticagrelor (BRILINTA) 90 MG TABS tablet Take 1 tablet (90 mg total) by mouth 2 (two) times daily. 180 tablet 2   No facility-administered medications prior to visit.     Medication list after today's encounter   Current Outpatient Medications  Medication Instructions   aspirin 81 mg, Oral, Daily   atorvastatin (LIPITOR) 80 mg, Oral, Daily   empagliflozin (JARDIANCE) 25 mg, Oral, Daily before  breakfast   losartan (COZAAR) 25 mg, Oral, Daily   metoprolol succinate (TOPROL-XL) 25 mg, Oral, Daily, Take with or immediately following a meal.   nitroGLYCERIN (NITROSTAT) 0.4  mg, Sublingual, Every 5 min PRN   Omega 3 1000 MG CAPS 1 capsule, Oral, Daily    Radiology:   No results found.  Cardiac Studies:   Coronary angiogram 08/14/2020 for acute inferior STEMI: 1. Inferior STEMI secondary to thrombotic stenosis of the distal RCA. Severe stenosis mid RCA as well. Successful PTCA/DES x 1 distal RCA and successful PTCA/DES x 1 mid RCA, 2 overlapping 3.5 x 20 and a 3.0 x 32 mm Synergy XD stents. 2. Moderate stenosis in the mid LAD of 60% and mid Circumflex of 50%. These lesions do not appear to be flow limiting.  Echocardiogram 08/15/2020:  1. Akinesis of the inferior wall (base, mid). Left ventricular ejection fraction, by estimation, is 55 to 60%. The left ventricle has normal function. The left ventricle has no regional wall motion abnormalities. Left ventricular diastolic parameters were normal.  2. Right ventricular systolic function is normal. The right ventricular size is normal.  3. The mitral valve is normal in structure. Trivial mitral valve regurgitation.  4. Aortic valve regurgitation is not visualized. Mild aortic valve sclerosis is present, with no evidence of aortic valve stenosis.  EKG:   EKG 04/19/2021: Normal sinus rhythm at rate of 74 bpm, left axis deviation, left anterior fascicular block.  Incomplete right bundle branch block.  No evidence of ischemia.  Compared to 09/01/2020, Q wave in 2 3 aVF and T wave inversion no longer present.  Assessment     ICD-10-CM   1. Coronary artery disease involving native coronary artery of native heart without angina pectoris  I25.10 EKG 12-Lead    metoprolol succinate (TOPROL-XL) 25 MG 24 hr tablet    High sensitivity CRP    CMP14+EGFR    CBC    2. Type 2 diabetes mellitus with complication, without long-term current use of  insulin (HCC)  E11.8 empagliflozin (JARDIANCE) 25 MG TABS tablet    losartan (COZAAR) 25 MG tablet    TSH    Hgb A1c w/o eAG    Urinalysis, Routine w reflex microscopic    3. Mixed hyperlipidemia  E78.2 atorvastatin (LIPITOR) 80 MG tablet    Lipid Panel With LDL/HDL Ratio       Medications Discontinued During This Encounter  Medication Reason   metFORMIN (GLUCOPHAGE) 500 MG tablet    metoprolol tartrate (LOPRESSOR) 25 MG tablet    ticagrelor (BRILINTA) 90 MG TABS tablet    atorvastatin (LIPITOR) 80 MG tablet Reorder    Meds ordered this encounter  Medications   empagliflozin (JARDIANCE) 25 MG TABS tablet    Sig: Take 1 tablet (25 mg total) by mouth daily before breakfast.    Dispense:  30 tablet    Refill:  2   metoprolol succinate (TOPROL-XL) 25 MG 24 hr tablet    Sig: Take 1 tablet (25 mg total) by mouth daily. Take with or immediately following a meal.    Dispense:  30 tablet    Refill:  2   losartan (COZAAR) 25 MG tablet    Sig: Take 1 tablet (25 mg total) by mouth daily.    Dispense:  30 tablet    Refill:  2   atorvastatin (LIPITOR) 80 MG tablet    Sig: Take 1 tablet (80 mg total) by mouth daily.    Dispense:  90 tablet    Refill:  2   Orders Placed This Encounter  Procedures   Lipid Panel With LDL/HDL Ratio   TSH   Hgb A1c w/o eAG   High  sensitivity CRP   CMP14+EGFR   CBC   Urinalysis, Routine w reflex microscopic   EKG 12-Lead   Recommendations:   Jason Joseph is a 56 y.o. Caucasian male patient with uncontrolled diabetes mellitus, mixed hyperlipidemia, presenting with acute inferior STEMI on 08/14/2020 underwent primary PCI with 2 overlapping stent implantation to RCA.  Has moderate disease in the LCx and LAD with preserved LVEF.    He has not had a follow-up since May 2022.  Now presents to establish care. He has not seen a physician in > 6 months. He wants to establish care with Dr. Jeneen Rinks Kim's group as he followed with them in past.  Is presently not on  any diabetic medications.  He remains asymptomatic without any chest pain or dyspnea.  He has lost about 40 pounds in weight over the past 1 year or so, and has maintained weight loss.  He is no meaningfully employed and his anxiety and depression post myocardial infarction is essentially resolved.  I would like to repeat his lipids and routine labs, will send a referral to see Dr. [name] for establishing for primary care needs.  For now for diabetes mellitus he is not on any medication, previously on metformin, I will start him on Jardiance 25 mg daily.  He does not have hypertension however in view of diabetes mellitus and CAD, low-dose metoprolol succinate 25 mg daily and losartan 25 mg in the evening Rx'd.  He can stop his dual antiplatelet therapy after the prescription runs out for a total of 1 year duration and continue aspirin indefinitely.  Continue high intensity statins as well.  I would like to see him back in 4 to 6 weeks for follow-up.    Adrian Prows, MD, [facility] 04/19/2021, 8:31 PM Office: [phone]

## 2021-04-19 ENCOUNTER — Other Ambulatory Visit: Payer: Self-pay

## 2021-04-19 ENCOUNTER — Ambulatory Visit: Payer: 59 | Admitting: Cardiology

## 2021-04-19 ENCOUNTER — Encounter: Payer: Self-pay | Admitting: Cardiology

## 2021-04-19 VITALS — BP 148/74 | HR 79 | Temp 98.6°F | Resp 17 | Ht 64.5 in | Wt 170.0 lb

## 2021-04-19 DIAGNOSIS — E782 Mixed hyperlipidemia: Secondary | ICD-10-CM

## 2021-04-19 DIAGNOSIS — I2510 Atherosclerotic heart disease of native coronary artery without angina pectoris: Secondary | ICD-10-CM

## 2021-04-19 DIAGNOSIS — E118 Type 2 diabetes mellitus with unspecified complications: Secondary | ICD-10-CM

## 2021-04-19 MED ORDER — EMPAGLIFLOZIN 25 MG PO TABS: 25.0000 mg | ORAL_TABLET | Freq: Every day | ORAL | 2 refills | Status: DC

## 2021-04-19 MED ORDER — ATORVASTATIN CALCIUM 80 MG PO TABS: 80.0000 mg | ORAL_TABLET | Freq: Every day | ORAL | 2 refills | Status: DC

## 2021-04-19 MED ORDER — LOSARTAN POTASSIUM 25 MG PO TABS: 25.0000 mg | ORAL_TABLET | Freq: Every day | ORAL | 2 refills | Status: DC

## 2021-04-19 MED ORDER — METOPROLOL SUCCINATE ER 25 MG PO TB24: 25.0000 mg | ORAL_TABLET | Freq: Every day | ORAL | 2 refills | Status: DC

## 2021-04-21 LAB — CMP14+EGFR
ALT: 39 IU/L (ref 0–44)
AST: 23 IU/L (ref 0–40)
Albumin/Globulin Ratio: 1.7 (ref 1.2–2.2)
Albumin: 4.7 g/dL (ref 3.8–4.9)
Alkaline Phosphatase: 60 IU/L (ref 44–121)
BUN/Creatinine Ratio: 14 (ref 9–20)
BUN: 14 mg/dL (ref 6–24)
Bilirubin Total: 0.5 mg/dL (ref 0.0–1.2)
CO2: 24 mmol/L (ref 20–29)
Calcium: 9.8 mg/dL (ref 8.7–10.2)
Chloride: 99 mmol/L (ref 96–106)
Creatinine, Ser: 1.03 mg/dL (ref 0.76–1.27)
Globulin, Total: 2.7 g/dL (ref 1.5–4.5)
Glucose: 131 mg/dL — ABNORMAL HIGH (ref 70–99)
Potassium: 4.8 mmol/L (ref 3.5–5.2)
Sodium: 140 mmol/L (ref 134–144)
Total Protein: 7.4 g/dL (ref 6.0–8.5)
eGFR: 84 mL/min/{1.73_m2} (ref >59)

## 2021-04-21 LAB — TSH: TSH: 1.83 u[IU]/mL (ref 0.450–4.500)

## 2021-04-21 LAB — CBC
Hematocrit: 46.5 % (ref 37.5–51.0)
Hemoglobin: 16.3 g/dL (ref 13.0–17.7)
MCH: 32.3 pg (ref 26.6–33.0)
MCHC: 35.1 g/dL (ref 31.5–35.7)
MCV: 92 fL (ref 79–97)
Platelets: 310 10*3/uL (ref 150–450)
RBC: 5.04 x10E6/uL (ref 4.14–5.80)
RDW: 11.8 % (ref 11.6–15.4)
WBC: 11.4 10*3/uL — ABNORMAL HIGH (ref 3.4–10.8)

## 2021-04-21 LAB — URINALYSIS, ROUTINE W REFLEX MICROSCOPIC
Bilirubin, UA: NEGATIVE
Ketones, UA: NEGATIVE
Leukocytes,UA: NEGATIVE
Nitrite, UA: NEGATIVE
Protein,UA: NEGATIVE
RBC, UA: NEGATIVE
Specific Gravity, UA: 1.021 (ref 1.005–1.030)
Urobilinogen, Ur: 0.2 mg/dL (ref 0.2–1.0)
pH, UA: 6.5 (ref 5.0–7.5)

## 2021-04-21 LAB — HIGH SENSITIVITY CRP: CRP, High Sensitivity: 0.54 mg/L (ref 0.00–3.00)

## 2021-04-21 LAB — LIPID PANEL WITH LDL/HDL RATIO
Cholesterol, Total: 134 mg/dL (ref 100–199)
HDL: 31 mg/dL — ABNORMAL LOW (ref >39)
LDL Chol Calc (NIH): 73 mg/dL (ref 0–99)
LDL/HDL Ratio: 2.4 ratio (ref 0.0–3.6)
Triglycerides: 173 mg/dL — ABNORMAL HIGH (ref 0–149)
VLDL Cholesterol Cal: 30 mg/dL (ref 5–40)

## 2021-04-21 LAB — HGB A1C W/O EAG: Hgb A1c MFr Bld: 7.6 % — ABNORMAL HIGH (ref 4.8–5.6)

## 2021-05-03 ENCOUNTER — Telehealth: Payer: Self-pay | Admitting: Cardiology

## 2021-05-03 ENCOUNTER — Other Ambulatory Visit: Payer: Self-pay

## 2021-05-03 DIAGNOSIS — E118 Type 2 diabetes mellitus with unspecified complications: Secondary | ICD-10-CM

## 2021-05-03 MED ORDER — EMPAGLIFLOZIN 25 MG PO TABS: 25.0000 mg | ORAL_TABLET | Freq: Every day | ORAL | 2 refills | Status: DC

## 2021-05-03 NOTE — Telephone Encounter (Signed)
Patient requesting refill for jardiance. Her phone cut out a few times, so I'm not 100% if she says [facility] sent over a form for the jardiance and are waiting on [place] or if she says she needs something sent to [facility] for the prescription. Can reach her at [phone].

## 2021-05-03 NOTE — Telephone Encounter (Signed)
done

## 2021-05-04 ENCOUNTER — Other Ambulatory Visit: Payer: Self-pay

## 2021-05-04 DIAGNOSIS — E118 Type 2 diabetes mellitus with unspecified complications: Secondary | ICD-10-CM

## 2021-05-04 MED ORDER — EMPAGLIFLOZIN 25 MG PO TABS: 25.0000 mg | ORAL_TABLET | Freq: Every day | ORAL | 2 refills | Status: DC

## 2021-06-01 ENCOUNTER — Encounter: Payer: Self-pay | Admitting: Cardiology

## 2021-06-01 ENCOUNTER — Ambulatory Visit: Payer: 59 | Admitting: Cardiology

## 2021-06-01 ENCOUNTER — Other Ambulatory Visit: Payer: Self-pay

## 2021-06-01 VITALS — BP 128/75 | HR 72 | Temp 98.5°F | Resp 16 | Ht 64.5 in | Wt 173.4 lb

## 2021-06-01 DIAGNOSIS — I2510 Atherosclerotic heart disease of native coronary artery without angina pectoris: Secondary | ICD-10-CM

## 2021-06-01 DIAGNOSIS — E1165 Type 2 diabetes mellitus with hyperglycemia: Secondary | ICD-10-CM

## 2021-06-01 DIAGNOSIS — E782 Mixed hyperlipidemia: Secondary | ICD-10-CM

## 2021-06-01 NOTE — Progress Notes (Signed)
Primary Physician/Referring:  Irena Reichmann, DO  Patient ID: Jason Joseph, male    DOB: July 22, 1965, 56 y.o.   MRN: [id number]  Chief Complaint  Patient presents with   Coronary Artery Disease   Hyperlipidemia   Results   Follow-up    6 weeks   HPI:    Jason Joseph  is a 56 y.o. Caucasian male patient with uncontrolled diabetes mellitus, mixed hyperlipidemia, family history of premature coronary disease with father having had myocardial infarction in his early 58 years of age, presenting with acute inferior STEMI on 08/14/2020 underwent primary PCI with 2 overlapping stent implantation to RCA.  Has moderate disease in the LCx and LAD with preserved LVEF.    He remains asymptomatic, states that since I initiated him on Jardiance, his blood sugars have been under excellent control.  He and his wife have made lifestyle changes at home and they have been walking and exercising on a regular basis without chest pain, dyspnea or dizziness.  Overall he is feeling the best he has in quite a while.  Past Medical History:  Diagnosis Date   CAD (coronary artery disease)    Hyperlipidemia    Type 2 diabetes mellitus with complication, without long-term current use of insulin (HCC)     Family History  Problem Relation Age of Onset   Heart attack Father 38   Heart disease Father    Prostate cancer Father    Hypertension Brother    Heart disease Brother     Social History   Tobacco Use   Smoking status: Former    Types: Cigarettes    Start date: 04/15/2011   Smokeless tobacco: Never  Substance Use Topics   Alcohol use: Never   Marital Status: Married  ROS  Review of Systems  Cardiovascular:  Negative for chest pain, dyspnea on exertion and leg swelling.  Gastrointestinal:  Negative for melena.  Objective  Blood pressure 128/75, pulse 72, temperature 98.5 F (36.9 C), temperature source Temporal, resp. rate 16, height 5' 4.5" (1.638 m), weight 173 lb 6.4 oz (78.7 kg), SpO2 94 %. Body  mass index is 29.3 kg/m.  Vitals with BMI 06/01/2021 04/19/2021 11/07/2020  Height 5' 4.5" 5' 4.5" 5' 4.5"  Weight 173 lbs 6 oz 170 lbs 170 lbs  BMI 29.32 28.74 28.74  Systolic 128 148 106  Diastolic 75 74 68  Pulse 72 79 68    Physical Exam Neck:     Vascular: No carotid bruit or JVD.  Cardiovascular:     Rate and Rhythm: Normal rate and regular rhythm.     Pulses: Intact distal pulses.     Heart sounds: Normal heart sounds. No murmur heard.   No gallop.  Pulmonary:     Effort: Pulmonary effort is normal.     Breath sounds: Normal breath sounds.  Abdominal:     General: Bowel sounds are normal.     Palpations: Abdomen is soft.  Musculoskeletal:        General: No swelling.     Laboratory examination:   Recent Labs    08/14/20 1203 08/14/20 1303 08/15/20 0028 11/07/20 1351 04/20/21 0917  NA 137   < > 136 138 140  K 3.8   < > 3.6 4.3 4.8  CL 104   < > 108 103 99  CO2 20*  --  20* 25 24  GLUCOSE 190*   < > 134* 113* 131*  BUN 15   < > 9 13 14  CREATININE 1.14   < > 0.83 0.93 1.03  CALCIUM 9.7  --  8.3* 9.6 9.8  GFRNONAA >60  --  >60  --   --    < > = values in this interval not displayed.   CrCl cannot be calculated (Patient's most recent lab result is older than the maximum 21 days allowed.).  CMP Latest Ref Rng & Units 04/20/2021 11/07/2020 08/15/2020  Glucose 70 - 99 mg/dL 131(H) 113(H) 134(H)  BUN 6 - 24 mg/dL 14 13 9   Creatinine 0.76 - 1.27 mg/dL 1.03 0.93 0.83  Sodium 134 - 144 mmol/L 140 138 136  Potassium 3.5 - 5.2 mmol/L 4.8 4.3 3.6  Chloride 96 - 106 mmol/L 99 103 108  CO2 20 - 29 mmol/L 24 25 20(L)  Calcium 8.7 - 10.2 mg/dL 9.8 9.6 8.3(L)  Total Protein 6.0 - 8.5 g/dL 7.4 - 6.1(L)  Total Bilirubin 0.0 - 1.2 mg/dL 0.5 - 0.7  Alkaline Phos 44 - 121 IU/L 60 - 38  AST 0 - 40 IU/L 23 - 49(H)  ALT 0 - 44 IU/L 39 - 47(H)   CBC Latest Ref Rng & Units 04/20/2021 08/15/2020 08/14/2020  WBC 3.4 - 10.8 x10E3/uL 11.4(H) 10.2 -  Hemoglobin 13.0 - 17.7 g/dL 16.3 14.5  13.3  Hematocrit 37.5 - 51.0 % 46.5 41.1 39.0  Platelets 150 - 450 x10E3/uL 310 274 -    Lipid Panel Recent Labs    08/14/20 1203 11/07/20 1351 04/20/21 0917  CHOL 222* 112 134  TRIG 572* 250.0* 173*  LDLCALC UNABLE TO CALCULATE IF TRIGLYCERIDE OVER 400 mg/dL  --  73  VLDL UNABLE TO CALCULATE IF TRIGLYCERIDE OVER 400 mg/dL 50.0*  --   HDL 30* 27.00* 31*  CHOLHDL 7.4 4  --   LDLDIRECT 95.0 53.0  --     HEMOGLOBIN A1C Lab Results  Component Value Date   HGBA1C 7.6 (H) 04/20/2021   MPG 162.81 08/14/2020   TSH Recent Labs    11/07/20 1351 04/20/21 0917  TSH 2.57 1.830   Medications and allergies  No Known Allergies   Medication list after today's encounter    Current Outpatient Medications:    aspirin 81 MG chewable tablet, Chew 1 tablet (81 mg total) by mouth daily., Disp: 90 tablet, Rfl: 2   atorvastatin (LIPITOR) 80 MG tablet, Take 1 tablet (80 mg total) by mouth daily., Disp: 90 tablet, Rfl: 2   empagliflozin (JARDIANCE) 25 MG TABS tablet, Take 1 tablet (25 mg total) by mouth daily before breakfast., Disp: 90 tablet, Rfl: 2   losartan (COZAAR) 25 MG tablet, Take 1 tablet (25 mg total) by mouth daily., Disp: 30 tablet, Rfl: 2   metoprolol succinate (TOPROL-XL) 25 MG 24 hr tablet, Take 1 tablet (25 mg total) by mouth daily. Take with or immediately following a meal., Disp: 30 tablet, Rfl: 2   nitroGLYCERIN (NITROSTAT) 0.4 MG SL tablet, Place 1 tablet (0.4 mg total) under the tongue every 5 (five) minutes as needed for chest pain., Disp: 25 tablet, Rfl: 2   Omega 3 1000 MG CAPS, Take 1 capsule by mouth daily., Disp: , Rfl:    Radiology:   No results found.  Cardiac Studies:   Coronary angiogram 08/14/2020 for acute inferior STEMI: 1. Inferior STEMI secondary to thrombotic stenosis of the distal RCA. Severe stenosis mid RCA as well. Successful PTCA/DES x 1 distal RCA and successful PTCA/DES x 1 mid RCA, 2 overlapping 3.5 x 20 and a 3.0 x 32 mm  Synergy XD stents. 2.  Moderate stenosis in the mid LAD of 60% and mid Circumflex of 50%. These lesions do not appear to be flow limiting.    Echocardiogram 08/15/2020:  1. Akinesis of the inferior wall (base, mid). Left ventricular ejection fraction, by estimation, is 55 to 60%. The left ventricle has normal function. The left ventricle has no regional wall motion abnormalities. Left ventricular diastolic parameters were normal.  2. Right ventricular systolic function is normal. The right ventricular size is normal.  3. The mitral valve is normal in structure. Trivial mitral valve regurgitation.  4. Aortic valve regurgitation is not visualized. Mild aortic valve sclerosis is present, with no evidence of aortic valve stenosis.  EKG:   EKG 04/19/2021: Normal sinus rhythm at rate of 74 bpm, left axis deviation, left anterior fascicular block.  Incomplete right bundle branch block.  No evidence of ischemia.  Compared to 09/01/2020, Q wave in 2 3 aVF and T wave inversion no longer present.  Assessment     ICD-10-CM   1. Coronary artery disease involving native coronary artery of native heart without angina pectoris  I25.10     2. Mixed hyperlipidemia  E78.2     3. Type 2 diabetes mellitus with hyperglycemia, without long-term current use of insulin (HCC)  E11.65      There are no discontinued medications.   No orders of the defined types were placed in this encounter.  No orders of the defined types were placed in this encounter.  Recommendations:   Cleburn Greiwe is a 56 y.o. Caucasian male patient with uncontrolled diabetes mellitus, mixed hyperlipidemia, family history of premature coronary disease with father having had myocardial infarction in his early 58 years of age, presenting with acute inferior STEMI on 08/14/2020 underwent primary PCI with 2 overlapping stent implantation to RCA.  Has moderate disease in the LCx and LAD with preserved LVEF.    He remains asymptomatic without any chest pain or dyspnea.  He  has lost about 40 pounds in weight over the past 1 year or so, and has maintained weight loss.  On his last office visit and also initiated him on Jardiance 25 mg daily, states that his blood sugars have significantly improved.  I have forwarded the labs that were performed by me to his PCP Dr. [name].  I reviewed the lipids, under excellent control.  No clinical evidence of heart failure, he has not had recurrence of angina pectoris, overall stable from cardiac standpoint.  Mildly elevated triglycerides will improve upon control of his diabetes mellitus.  He continues to exercise without any limitations.  I would like to see him back in 6 months for follow-up.       Adrian Prows, MD, [facility] 06/01/2021, 8:37 PM Office: [phone]

## 2021-07-11 ENCOUNTER — Other Ambulatory Visit: Payer: Self-pay | Admitting: Home Health

## 2021-07-11 DIAGNOSIS — E782 Mixed hyperlipidemia: Secondary | ICD-10-CM

## 2021-07-17 ENCOUNTER — Other Ambulatory Visit: Payer: Self-pay | Admitting: Cardiology

## 2021-07-17 DIAGNOSIS — E118 Type 2 diabetes mellitus with unspecified complications: Secondary | ICD-10-CM

## 2021-07-17 DIAGNOSIS — I2510 Atherosclerotic heart disease of native coronary artery without angina pectoris: Secondary | ICD-10-CM

## 2021-08-25 ENCOUNTER — Other Ambulatory Visit: Payer: Self-pay | Admitting: Home Health

## 2021-10-19 ENCOUNTER — Other Ambulatory Visit: Payer: Self-pay | Admitting: Cardiology

## 2021-10-19 DIAGNOSIS — E118 Type 2 diabetes mellitus with unspecified complications: Secondary | ICD-10-CM

## 2021-10-19 DIAGNOSIS — I2510 Atherosclerotic heart disease of native coronary artery without angina pectoris: Secondary | ICD-10-CM

## 2021-11-28 ENCOUNTER — Other Ambulatory Visit: Payer: Self-pay | Admitting: Cardiology

## 2021-11-28 DIAGNOSIS — E118 Type 2 diabetes mellitus with unspecified complications: Secondary | ICD-10-CM

## 2021-12-06 ENCOUNTER — Encounter: Payer: Self-pay | Admitting: Cardiology

## 2021-12-06 ENCOUNTER — Ambulatory Visit: Payer: 59 | Admitting: Cardiology

## 2021-12-06 VITALS — BP 149/81 | HR 77 | Temp 98.5°F | Resp 16 | Ht 64.0 in | Wt 176.2 lb

## 2021-12-06 DIAGNOSIS — E1165 Type 2 diabetes mellitus with hyperglycemia: Secondary | ICD-10-CM

## 2021-12-06 DIAGNOSIS — E118 Type 2 diabetes mellitus with unspecified complications: Secondary | ICD-10-CM

## 2021-12-06 DIAGNOSIS — E782 Mixed hyperlipidemia: Secondary | ICD-10-CM

## 2021-12-06 DIAGNOSIS — I2510 Atherosclerotic heart disease of native coronary artery without angina pectoris: Secondary | ICD-10-CM

## 2021-12-06 DIAGNOSIS — I10 Essential (primary) hypertension: Secondary | ICD-10-CM

## 2021-12-06 MED ORDER — LOSARTAN POTASSIUM 50 MG PO TABS: 50.0000 mg | ORAL_TABLET | Freq: Every evening | ORAL | 3 refills | Status: AC

## 2021-12-06 NOTE — Progress Notes (Signed)
Primary Physician/Referring:  Jason Morning, DO  Patient ID: Jason Joseph, male    DOB: Oct 09, 1965, 56 y.o.   MRN: [id number]  Chief Complaint  Patient presents with   Coronary Artery Disease   Hyperlipidemia   Follow-up    6 months   HPI:    Jason Joseph  is a 56 y.o. Caucasian male patient with uncontrolled diabetes mellitus, mixed hyperlipidemia, family history of premature coronary disease with father having had myocardial infarction in his early 58 years of age, presenting with acute inferior STEMI on 08/14/2020 underwent primary PCI with 2 overlapping stent implantation to RCA.  Has moderate disease in the LCx and LAD with preserved LVEF.    He remains asymptomatic, states that since I initiated him on Jardiance, his blood sugars have been under excellent control.  He and his wife have made lifestyle changes at home and they have been walking and exercising on a regular basis without chest pain, dyspnea or dizziness. His blood pressure is elevated today and he reports episodes of systolic blood pressure [id number] at home. Overall, he is feeling well.  Past Medical History:  Diagnosis Date   CAD (coronary artery disease)    Hyperlipidemia    Type 2 diabetes mellitus with complication, without long-term current use of insulin ([place])    Family History  Problem Relation Age of Onset   Heart attack Father 7   Heart disease Father    Prostate cancer Father    Hypertension Brother    Heart disease Brother     Social History   Tobacco Use   Smoking status: Former    Packs/day: 0.25    Years: 25.00    Total pack years: 6.25    Types: Cigarettes    Start date: 04/15/2011    Quit date: 04/16/2011    Years since quitting: 10.6   Smokeless tobacco: Never  Substance Use Topics   Alcohol use: Never   Marital Status: Married  ROS  Review of Systems  Cardiovascular:  Negative for chest pain, dyspnea on exertion and leg swelling.  Gastrointestinal:  Negative for melena.   Objective   Blood pressure (!) 149/81, pulse 77, temperature 98.5 F (36.9 C), temperature source Temporal, resp. rate 16, height 5\' 4"  (1.626 m), weight 176 lb 3.2 oz (79.9 kg), SpO2 97 %. Body mass index is 30.24 kg/m.     12/06/2021    3:50 PM 12/06/2021    3:34 PM 06/01/2021    2:58 PM  Vitals with BMI  Height  5\' 4"  5' 4.5"  Weight  176 lbs 3 oz 173 lbs 6 oz  BMI  [id number]  Systolic 123456 [id number]  Diastolic 81 80 75  Pulse 77 76 72    Physical Exam Neck:     Vascular: No carotid bruit or JVD.  Cardiovascular:     Rate and Rhythm: Normal rate and regular rhythm.     Pulses: Intact distal pulses.     Heart sounds: Normal heart sounds. No murmur heard.    No gallop.  Pulmonary:     Effort: Pulmonary effort is normal.     Breath sounds: Normal breath sounds.  Abdominal:     General: Bowel sounds are normal.     Palpations: Abdomen is soft.  Musculoskeletal:        General: No swelling.     Laboratory examination:      Latest Ref Rng & Units 04/20/2021    9:17 AM 11/07/2020  1:51 PM 08/15/2020   12:28 AM  CMP  Glucose 70 - 99 mg/dL 338  250  539   BUN 6 - 24 mg/dL 14  13  9    Creatinine 0.76 - 1.27 mg/dL  7.67  3.41   Sodium 134 - 144 mmol/L 140  138  136   Potassium 3.5 - 5.2 mmol/L 4.8  4.3  3.6   Chloride 96 - 106 mmol/L 99  103  108   CO2 20 - 29 mmol/L 24  25  20    Calcium 8.7 - 10.2 mg/dL 9.8  9.6  8.3   Total Protein 6.0 - 8.5 g/dL 7.4   6.1   Total Bilirubin 0.0 - 1.2 mg/dL 0.5   0.7   Alkaline Phos 44 - 121 IU/L 60   38   AST 0 - 40 IU/L 23   49   ALT 0 - 44 IU/L 39   47       Latest Ref Rng & Units 04/20/2021    9:17 AM 08/15/2020   12:28 AM 08/14/2020    1:03 PM  CBC  WBC 3.4 - 10.8 x10E3/uL 11.4  10.2    Hemoglobin 13.0 - 17.7 g/dL 08/17/2020  08/16/2020  90.2   Hematocrit 37.5 - 51.0 % 46.5  41.1  39.0   Platelets 150 - 450 x10E3/uL 310  274     Lipid Panel     Component Value Date/Time   CHOL 134 04/20/2021 0917   TRIG 173 (H) 04/20/2021 0917   HDL 31 (L)  04/20/2021 0917   CHOLHDL 4 11/07/2020 1351   VLDL 50.0 (H) 11/07/2020 1351   LDLCALC 73 04/20/2021 0917   LDLDIRECT 53.0 11/07/2020 1351   HEMOGLOBIN A1C Lab Results  Component Value Date   HGBA1C 7.6 (H) 04/20/2021   MPG 162.81 08/14/2020   TSH Recent Labs    04/20/21 0917  TSH 1.830    Hgb A1C Lab Results  Component Value Date   HGBA1C 7.6 (H) 04/20/2021    Medications and allergies  No Known Allergies   Medication list after today's encounter    Current Outpatient Medications:    aspirin 81 MG chewable tablet, Chew 1 tablet (81 mg total) by mouth daily., Disp: 90 tablet, Rfl: 2   atorvastatin (LIPITOR) 80 MG tablet, TAKE 1 TABLET (80 MG TOTAL) BY MOUTH DAILY, Disp: 90 tablet, Rfl: 1   JARDIANCE 25 MG TABS tablet, TAKE 1 TABLET BY MOUTH DAILY  BEFORE BREAKFAST, Disp: 90 tablet, Rfl: 3   metoprolol succinate (TOPROL-XL) 25 MG 24 hr tablet, TAKE 1 TABLET (25 MG TOTAL) BY MOUTH DAILY. TAKE WITH OR IMMEDIATELY FOLLOWING A MEAL., Disp: 30 tablet, Rfl: 2   nitroGLYCERIN (NITROSTAT) 0.4 MG SL tablet, Place 1 tablet (0.4 mg total) under the tongue every 5 (five) minutes as needed for chest pain., Disp: 25 tablet, Rfl: 3   Omega 3 1000 MG CAPS, Take 1 capsule by mouth daily., Disp: , Rfl:    losartan (COZAAR) 50 MG tablet, Take 1 tablet (50 mg total) by mouth every evening., Disp: 90 tablet, Rfl: 3   Radiology:   No results found.  Cardiac Studies:   Coronary angiogram 08/14/2020 for acute inferior STEMI: 1. Inferior STEMI secondary to thrombotic stenosis of the distal RCA. Severe stenosis mid RCA as well. Successful PTCA/DES x 1 distal RCA and successful PTCA/DES x 1 mid RCA, 2 overlapping 3.5 x 20 and a 3.0 x 32 mm Synergy XD stents. 2. Moderate  stenosis in the mid LAD of 60% and mid Circumflex of 50%. These lesions do not appear to be flow limiting.    Echocardiogram 08/15/2020:  1. Akinesis of the inferior wall (base, mid). Left ventricular ejection fraction, by  estimation, is 55 to 60%. The left ventricle has normal function. The left ventricle has no regional wall motion abnormalities. Left ventricular diastolic parameters were normal.  2. Right ventricular systolic function is normal. The right ventricular size is normal.  3. The mitral valve is normal in structure. Trivial mitral valve regurgitation.  4. Aortic valve regurgitation is not visualized. Mild aortic valve sclerosis is present, with no evidence of aortic valve stenosis.  EKG:   EKG 12/06/2021: Normal sinus rhythm at rate of 76 bpm, left atrial enlargement, left axis deviation, left anterior fascicular block.  Incomplete right bundle branch block.  No evidence of ischemia. No significant change from 04/19/2021.     Assessment     ICD-10-CM   1. Coronary artery disease involving native coronary artery of native heart without angina pectoris  I25.10 EKG 12-Lead    losartan (COZAAR) 50 MG tablet    2. Type 2 diabetes mellitus with hyperglycemia, without long-term current use of insulin (HCC)  E11.65     3. Mixed hyperlipidemia  E78.2     4. Type 2 diabetes mellitus with complication, without long-term current use of insulin (HCC)  E11.8     5. Primary hypertension  I10 losartan (COZAAR) 50 MG tablet      Medications Discontinued During This Encounter  Medication Reason   losartan (COZAAR) 25 MG tablet Reorder     Meds ordered this encounter  Medications   losartan (COZAAR) 50 MG tablet    Sig: Take 1 tablet (50 mg total) by mouth every evening.    Dispense:  90 tablet    Refill:  3   Orders Placed This Encounter  Procedures   EKG 12-Lead    Recommendations:   Jason Joseph is a 56 y.o. Caucasian male patient with uncontrolled diabetes mellitus, mixed hyperlipidemia, family history of premature coronary disease with father having had myocardial infarction in his early 58 years of age, presenting with acute inferior STEMI on 08/14/2020 underwent primary PCI with 2 overlapping  stent implantation to RCA.  Has moderate disease in the LCx and LAD with preserved LVEF.   He remains asymptomatic without any chest pain or dyspnea.  He has lost about 40 pounds in weight over the past 1 year or so, and has maintained weight loss.  He is walking 2 miles daily without limitations. He continues on Jardiance 25 mg daily, states that his blood sugars have significantly improved.  He will see his PCP next month and have labs checked prior to that visit. Given elevated blood pressure today and episodes of SBP >130 with home readings, will increase losartan to 50mg  every evening. Discussed continuing to check blood pressure at home and report frequent readings of SBP >130. If so, consider adding Amlodipine. His lipids are at goal, suspect his TG will have improved with Jardiance therapy as well, but needs f/u and has complete physical next month.   No clinical evidence of heart failure, he has not had recurrence of angina pectoris, overall stable from cardiac standpoint.    I would like to see him back in 1 year for follow-up.       , MD, [facility] 12/06/2021, 4:11 PM Office: [phone]

## 2022-01-20 ENCOUNTER — Other Ambulatory Visit: Payer: Self-pay | Admitting: Cardiology

## 2022-01-20 DIAGNOSIS — I2510 Atherosclerotic heart disease of native coronary artery without angina pectoris: Secondary | ICD-10-CM

## 2022-04-23 ENCOUNTER — Other Ambulatory Visit: Payer: Self-pay | Admitting: Cardiology

## 2022-04-23 DIAGNOSIS — I2510 Atherosclerotic heart disease of native coronary artery without angina pectoris: Secondary | ICD-10-CM

## 2022-07-23 ENCOUNTER — Other Ambulatory Visit: Payer: Self-pay | Admitting: Cardiology

## 2022-07-23 DIAGNOSIS — I2510 Atherosclerotic heart disease of native coronary artery without angina pectoris: Secondary | ICD-10-CM

## 2022-10-22 ENCOUNTER — Other Ambulatory Visit: Payer: Self-pay | Admitting: Cardiology

## 2022-10-22 DIAGNOSIS — I2510 Atherosclerotic heart disease of native coronary artery without angina pectoris: Secondary | ICD-10-CM

## 2022-12-06 ENCOUNTER — Ambulatory Visit: Payer: BC Managed Care – PPO | Admitting: Cardiology
